# Patient Record
Sex: Male | Born: 1950 | Race: White | Hispanic: No | Marital: Married | Smoking: Never smoker
Health system: Southern US, Community
[De-identification: ages and names within clinical notes are randomized; demographics above are authoritative.]

## PROBLEM LIST (undated history)

## (undated) DIAGNOSIS — Z860100 Personal history of colon polyps, unspecified: Secondary | ICD-10-CM

## (undated) DIAGNOSIS — C61 Malignant neoplasm of prostate: Secondary | ICD-10-CM

## (undated) DIAGNOSIS — R112 Nausea with vomiting, unspecified: Secondary | ICD-10-CM

## (undated) DIAGNOSIS — Z9889 Other specified postprocedural states: Secondary | ICD-10-CM

## (undated) DIAGNOSIS — M2550 Pain in unspecified joint: Secondary | ICD-10-CM

## (undated) DIAGNOSIS — K760 Fatty (change of) liver, not elsewhere classified: Secondary | ICD-10-CM

## (undated) DIAGNOSIS — Z87442 Personal history of urinary calculi: Secondary | ICD-10-CM

## (undated) DIAGNOSIS — Z86718 Personal history of other venous thrombosis and embolism: Secondary | ICD-10-CM

## (undated) DIAGNOSIS — K219 Gastro-esophageal reflux disease without esophagitis: Secondary | ICD-10-CM

## (undated) DIAGNOSIS — Z8489 Family history of other specified conditions: Secondary | ICD-10-CM

## (undated) DIAGNOSIS — I82409 Acute embolism and thrombosis of unspecified deep veins of unspecified lower extremity: Secondary | ICD-10-CM

## (undated) DIAGNOSIS — J302 Other seasonal allergic rhinitis: Secondary | ICD-10-CM

## (undated) DIAGNOSIS — I10 Essential (primary) hypertension: Secondary | ICD-10-CM

## (undated) DIAGNOSIS — Z923 Personal history of irradiation: Secondary | ICD-10-CM

## (undated) DIAGNOSIS — R51 Headache: Secondary | ICD-10-CM

## (undated) DIAGNOSIS — M1990 Unspecified osteoarthritis, unspecified site: Secondary | ICD-10-CM

## (undated) HISTORY — DX: Personal history of irradiation: Z92.3

## (undated) HISTORY — DX: Essential (primary) hypertension: I10

## (undated) HISTORY — PX: VASECTOMY: SHX75

## (undated) HISTORY — DX: Malignant neoplasm of prostate: C61

## (undated) HISTORY — PX: OTHER SURGICAL HISTORY: SHX169

## (undated) HISTORY — PX: COLONOSCOPY: SHX174

## (undated) HISTORY — DX: Gastro-esophageal reflux disease without esophagitis: K21.9

## (undated) HISTORY — DX: Acute embolism and thrombosis of unspecified deep veins of unspecified lower extremity: I82.409

---

## 1998-08-03 ENCOUNTER — Emergency Department (HOSPITAL_COMMUNITY): Admission: EM | Admit: 1998-08-03 | Discharge: 1998-08-03 | Payer: Self-pay

## 2000-03-09 ENCOUNTER — Emergency Department (HOSPITAL_COMMUNITY): Admission: EM | Admit: 2000-03-09 | Discharge: 2000-03-09 | Payer: Self-pay | Admitting: Emergency Medicine

## 2005-07-20 ENCOUNTER — Encounter: Admission: RE | Admit: 2005-07-20 | Discharge: 2005-07-20 | Payer: Self-pay | Admitting: Family Medicine

## 2006-08-12 ENCOUNTER — Encounter: Admission: RE | Admit: 2006-08-12 | Discharge: 2006-08-12 | Payer: Self-pay | Admitting: Family Medicine

## 2006-10-21 ENCOUNTER — Encounter: Admission: RE | Admit: 2006-10-21 | Discharge: 2006-10-21 | Payer: Self-pay | Admitting: Family Medicine

## 2007-04-22 ENCOUNTER — Encounter: Admission: RE | Admit: 2007-04-22 | Discharge: 2007-04-22 | Payer: Self-pay | Admitting: Family Medicine

## 2008-08-27 ENCOUNTER — Encounter: Admission: RE | Admit: 2008-08-27 | Discharge: 2008-08-27 | Payer: Self-pay | Admitting: Family Medicine

## 2010-03-06 ENCOUNTER — Encounter: Payer: Self-pay | Admitting: Family Medicine

## 2010-04-07 DIAGNOSIS — C61 Malignant neoplasm of prostate: Secondary | ICD-10-CM

## 2010-04-07 HISTORY — DX: Malignant neoplasm of prostate: C61

## 2010-04-18 ENCOUNTER — Other Ambulatory Visit (HOSPITAL_COMMUNITY): Payer: Self-pay | Admitting: Urology

## 2010-04-18 DIAGNOSIS — C61 Malignant neoplasm of prostate: Secondary | ICD-10-CM

## 2010-04-21 ENCOUNTER — Encounter (HOSPITAL_COMMUNITY): Payer: Self-pay

## 2010-04-21 ENCOUNTER — Ambulatory Visit (HOSPITAL_COMMUNITY): Payer: Self-pay

## 2010-04-26 ENCOUNTER — Encounter (HOSPITAL_COMMUNITY): Payer: Self-pay

## 2010-04-26 ENCOUNTER — Encounter (HOSPITAL_COMMUNITY)
Admission: RE | Admit: 2010-04-26 | Discharge: 2010-04-26 | Disposition: A | Discharge status: Home / Self Care | Payer: PRIVATE HEALTH INSURANCE | Source: Ambulatory Visit | Attending: Urology | Admitting: Urology

## 2010-04-26 ENCOUNTER — Ambulatory Visit (HOSPITAL_COMMUNITY)
Admission: RE | Admit: 2010-04-26 | Discharge: 2010-04-26 | Disposition: A | Discharge status: Home / Self Care | Payer: PRIVATE HEALTH INSURANCE | Source: Ambulatory Visit | Attending: Urology | Admitting: Urology

## 2010-04-26 DIAGNOSIS — C61 Malignant neoplasm of prostate: Secondary | ICD-10-CM | POA: Insufficient documentation

## 2010-04-26 MED ORDER — TECHNETIUM TC 99M MEDRONATE IV KIT
24.0000 | PACK | Freq: Once | INTRAVENOUS | Status: AC | PRN
  Administered 2010-04-26: 24 via INTRAVENOUS

## 2010-05-22 ENCOUNTER — Ambulatory Visit: Payer: PRIVATE HEALTH INSURANCE | Attending: Radiation Oncology | Admitting: Radiation Oncology

## 2010-05-22 DIAGNOSIS — I10 Essential (primary) hypertension: Secondary | ICD-10-CM | POA: Insufficient documentation

## 2010-05-22 DIAGNOSIS — Z86718 Personal history of other venous thrombosis and embolism: Secondary | ICD-10-CM | POA: Insufficient documentation

## 2010-05-22 DIAGNOSIS — Z79899 Other long term (current) drug therapy: Secondary | ICD-10-CM | POA: Insufficient documentation

## 2010-05-22 DIAGNOSIS — K219 Gastro-esophageal reflux disease without esophagitis: Secondary | ICD-10-CM | POA: Insufficient documentation

## 2010-05-22 DIAGNOSIS — C61 Malignant neoplasm of prostate: Secondary | ICD-10-CM | POA: Insufficient documentation

## 2010-05-22 DIAGNOSIS — E785 Hyperlipidemia, unspecified: Secondary | ICD-10-CM | POA: Insufficient documentation

## 2010-05-22 DIAGNOSIS — Z7982 Long term (current) use of aspirin: Secondary | ICD-10-CM | POA: Insufficient documentation

## 2010-05-22 DIAGNOSIS — Z806 Family history of leukemia: Secondary | ICD-10-CM | POA: Insufficient documentation

## 2010-06-07 ENCOUNTER — Other Ambulatory Visit (HOSPITAL_COMMUNITY): Payer: Self-pay | Admitting: Urology

## 2010-06-07 ENCOUNTER — Other Ambulatory Visit: Payer: Self-pay | Admitting: Urology

## 2010-06-07 ENCOUNTER — Encounter (HOSPITAL_COMMUNITY): Payer: PRIVATE HEALTH INSURANCE

## 2010-06-07 ENCOUNTER — Ambulatory Visit (HOSPITAL_COMMUNITY)
Admission: RE | Admit: 2010-06-07 | Discharge: 2010-06-07 | Disposition: A | Discharge status: Home / Self Care | Payer: PRIVATE HEALTH INSURANCE | Source: Ambulatory Visit | Attending: Urology | Admitting: Urology

## 2010-06-07 DIAGNOSIS — Z01818 Encounter for other preprocedural examination: Secondary | ICD-10-CM | POA: Insufficient documentation

## 2010-06-07 DIAGNOSIS — Z01812 Encounter for preprocedural laboratory examination: Secondary | ICD-10-CM | POA: Insufficient documentation

## 2010-06-07 DIAGNOSIS — C61 Malignant neoplasm of prostate: Secondary | ICD-10-CM | POA: Insufficient documentation

## 2010-06-07 DIAGNOSIS — I10 Essential (primary) hypertension: Secondary | ICD-10-CM | POA: Insufficient documentation

## 2010-06-07 DIAGNOSIS — Z01810 Encounter for preprocedural cardiovascular examination: Secondary | ICD-10-CM | POA: Insufficient documentation

## 2010-06-07 DIAGNOSIS — R9431 Abnormal electrocardiogram [ECG] [EKG]: Secondary | ICD-10-CM | POA: Insufficient documentation

## 2010-06-07 LAB — CBC
HCT: 41.7 % (ref 39.0–52.0)
Hemoglobin: 13.9 g/dL (ref 13.0–17.0)
MCH: 29 pg (ref 26.0–34.0)
MCHC: 33.3 g/dL (ref 30.0–36.0)
MCV: 86.9 fL (ref 78.0–100.0)
Platelets: 194 K/uL (ref 150–400)
RBC: 4.8 MIL/uL (ref 4.22–5.81)
RDW: 12.5 % (ref 11.5–15.5)
WBC: 6.7 K/uL (ref 4.0–10.5)

## 2010-06-07 LAB — BASIC METABOLIC PANEL WITH GFR
BUN: 11 mg/dL (ref 6–23)
CO2: 27 meq/L (ref 19–32)
Calcium: 9.3 mg/dL (ref 8.4–10.5)
Chloride: 105 meq/L (ref 96–112)
Creatinine, Ser: 1.06 mg/dL (ref 0.4–1.5)
GFR calc non Af Amer: >60 mL/min
Glucose, Bld: 103 mg/dL — ABNORMAL HIGH (ref 70–99)
Potassium: 3.9 meq/L (ref 3.5–5.1)
Sodium: 138 meq/L (ref 135–145)

## 2010-06-07 LAB — TYPE AND SCREEN
ABO/RH(D): O POS
Antibody Screen: NEGATIVE

## 2010-06-07 LAB — SURGICAL PCR SCREEN
MRSA, PCR: NEGATIVE
Staphylococcus aureus: NEGATIVE

## 2010-06-07 LAB — ABO/RH: ABO/RH(D): O POS

## 2010-06-12 ENCOUNTER — Inpatient Hospital Stay (HOSPITAL_COMMUNITY)
Admission: RE | Admit: 2010-06-12 | Discharge: 2010-06-12 | DRG: 724 | Disposition: A | Discharge status: Home / Self Care | Payer: PRIVATE HEALTH INSURANCE | Source: Ambulatory Visit | Attending: Urology | Admitting: Urology

## 2010-06-12 DIAGNOSIS — C61 Malignant neoplasm of prostate: Principal | ICD-10-CM | POA: Diagnosis present

## 2010-06-22 ENCOUNTER — Inpatient Hospital Stay (HOSPITAL_COMMUNITY): Admission: RE | Admit: 2010-06-22 | Payer: PRIVATE HEALTH INSURANCE | Source: Ambulatory Visit | Admitting: Urology

## 2010-06-22 ENCOUNTER — Inpatient Hospital Stay (HOSPITAL_COMMUNITY)
Admission: RE | Admit: 2010-06-22 | Discharge: 2010-06-23 | DRG: 708 | Disposition: A | Discharge status: Home / Self Care | Payer: PRIVATE HEALTH INSURANCE | Source: Ambulatory Visit | Attending: Urology | Admitting: Urology

## 2010-06-22 ENCOUNTER — Ambulatory Visit (HOSPITAL_COMMUNITY): Admission: RE | Admit: 2010-06-22 | Payer: PRIVATE HEALTH INSURANCE | Source: Ambulatory Visit | Admitting: Urology

## 2010-06-22 ENCOUNTER — Other Ambulatory Visit: Payer: Self-pay | Admitting: Urology

## 2010-06-22 DIAGNOSIS — E785 Hyperlipidemia, unspecified: Secondary | ICD-10-CM | POA: Diagnosis present

## 2010-06-22 DIAGNOSIS — K219 Gastro-esophageal reflux disease without esophagitis: Secondary | ICD-10-CM | POA: Diagnosis present

## 2010-06-22 DIAGNOSIS — Z7982 Long term (current) use of aspirin: Secondary | ICD-10-CM

## 2010-06-22 DIAGNOSIS — Z01812 Encounter for preprocedural laboratory examination: Secondary | ICD-10-CM

## 2010-06-22 DIAGNOSIS — C61 Malignant neoplasm of prostate: Principal | ICD-10-CM | POA: Diagnosis present

## 2010-06-22 DIAGNOSIS — I10 Essential (primary) hypertension: Secondary | ICD-10-CM | POA: Diagnosis present

## 2010-06-22 DIAGNOSIS — M109 Gout, unspecified: Secondary | ICD-10-CM | POA: Diagnosis present

## 2010-06-22 DIAGNOSIS — Z87442 Personal history of urinary calculi: Secondary | ICD-10-CM

## 2010-06-22 DIAGNOSIS — Z86718 Personal history of other venous thrombosis and embolism: Secondary | ICD-10-CM

## 2010-06-22 DIAGNOSIS — Z79899 Other long term (current) drug therapy: Secondary | ICD-10-CM

## 2010-06-22 LAB — TYPE AND SCREEN
ABO/RH(D): O POS
Antibody Screen: NEGATIVE

## 2010-06-22 LAB — HEMOGLOBIN AND HEMATOCRIT, BLOOD: HCT: 40.3 % (ref 39.0–52.0)

## 2010-06-23 LAB — HEMOGLOBIN AND HEMATOCRIT, BLOOD: Hemoglobin: 13.1 g/dL (ref 13.0–17.0)

## 2010-06-26 NOTE — Op Note (Signed)
Joshua Young, Joshua Young               ACCOUNT NO.:  [id number]  MEDICAL RECORD NO.:  [id number]           PATIENT TYPE:  O  LOCATION:  DAYL                         FACILITY:  [facility]  PHYSICIAN:  Heloise Purpura, MD      DATE OF BIRTH:  05-Jun-1950  DATE OF PROCEDURE:  06/22/2010 DATE OF DISCHARGE:                              OPERATIVE REPORT   PREOPERATIVE DIAGNOSIS:  Clinically localized adenocarcinoma of prostate (clinical stage T2b, N0, M0).  POSTOPERATIVE DIAGNOSIS:  Clinically localized adenocarcinoma of prostate (clinical stage T2b, N0, M0).  PROCEDURES: 1. Robotic-assisted laparoscopic radical prostatectomy (left nerve     sparing). 2. Extended bilateral robotic-assisted laparoscopic pelvic     lymphadenectomy.  SURGEON:  Heloise Purpura, MD  ASSISTANT:  Delia Chimes, NP  ANESTHESIA:  General.  COMPLICATIONS:  None.  ESTIMATED BLOOD LOSS:  100 mL.  INTRAVENOUS FLUIDS:  1500 mL  of lactated Ringer's.  SPECIMENS: 1. Prostate and seminal vesicles. 2. Right pelvic lymph nodes. 3. Left pelvic lymph nodes.  DISPOSITION:  Specimens to pathology.  DRAINS: 1. 20-French Coude catheter. 2. #19 Blake pelvic drain.  INDICATIONS:  [name] is a 59 year old gentleman who was found to have clinically localized prostate cancer.  He was noted to have high- risk disease and underwent staging studies which were negative for obvious metastatic disease.  After discussing management options for treatment, he elected to proceed with primary surgical therapy, understanding the high risk for potentially needing multimodality therapy in the adjuvant or salvage setting.  The potential risks, complications, and alternative treatment options associated with the above procedures were discussed in detail and informed consent was obtained.  DESCRIPTION OF PROCEDURE:  The patient was taken to the operating room and a general anesthetic was administered.  He was given  preoperative antibiotics, placed in the dorsal lithotomy position, and prepped and draped in the usual sterile fashion.  Next, a Foley catheter was inserted into the bladder and a site was selected just superior to the umbilicus for placement of the camera port.  This was placed using a standard open Hasson technique which allowed entry into the peritoneal cavity under direct vision without difficulty.  12-mm port was placed and a pneumoperitoneum established.  The 0-degree lens was then used to inspect the abdomen and there was no evidence of any intra-abdominal injuries or other abnormalities.  The remaining ports were then placed with 8-mm robotic ports placed in the right lower quadrant, left lower quadrant, and far left lower quadrant, 5-mm port was placed between the camera port and the right robotic port and a 12-mm port was placed in the far right lateral abdominal wall for laparoscopic assistance.  All ports were placed under direct vision without difficulty.  The surgical cart was then docked.  With the aid of the cautery scissors, the bladder was reflected posteriorly, allowing entry into space of Retzius and identification of the endopelvic fascia and prostate.  The endopelvic fascia was then incised from the apex back to the base of the prostate bilaterally and the underlying levator muscle fibers were swept laterally off the prostate, thereby isolating the dorsal venous complex.  The dorsal vein was then stapled and divided with a 45-mm flex Echelon stapler.  The bladder neck was identified with the aid of Foley catheter manipulation and was divided anteriorly.  This exposed the catheter and the catheter balloon was deflated.  The catheter was brought into the operative field and used to retract the prostate anteriorly.  The posterior bladder neck was then divided and dissection proceeded between the prostate and bladder until the vasa deferentia and seminal vesicles were  identified.  The vasa deferentia were isolated, divided and lifted anteriorly and the seminal vesicles were also dissected down to their tips with care to control seminal vesicle arterial blood supply.  These structures were then also lifted anteriorly and the space between Denonvilliers' fascia and the anterior rectum was developed with a combination of blunt and sharp dissection.  This isolated the vascular pedicles of prostate.  On the left side, the lateral prostatic fascia was sharply incised, allowing the neurovascular bundles to be released. The vascular pedicles of prostate were ligated with Hem-o-Lok clips and divided with sharp cold scissor dissection.  On the right side of the prostate, the vascular pedicles were ligated in a wide non-nerve sparing fashion with Hem-o-Lok clips and divided.  The urethra was sharply transected, allowing the prostate specimen to be disarticulated.  The pelvis was copiously irrigated and hemostasis was ensured.  There was no evidence of a rectal injury.  Attention then turned to the right pelvic sidewall.  An extended pelvic lymphadenectomy was then performed with the fibrofatty tissue removed from the genitofemoral nerve to Cooper's ligament to the hypogastric artery posteriorly and the bifurcation of the iliac vessels superiorly with care to preserve the obturator nerve and vascular structures within this template.  Hemo-o-Lok clips were used for lymphostasis and hemostasis and these lymphatic packets were passed off for permanent pathologic analysis.  An identical procedure was then performed on the contralateral side.  Attention then turned to the urethral anastomosis.  2-0 Vicryl slip-knot was placed between the Denonvilliers' fascia, the posterior bladder neck, and the posterior urethra to re-approximate these structures.  A double-armed 3-0 Monocryl suture was then used to perform a 360-degree running tension-free anastomosis between these  structures.  A new 20-French Coude catheter was inserted into the bladder and irrigated.  There were no blood clots within the bladder and the anastomosis appeared to be watertight.  A #19 Blake drain was then brought to the left robotic port and positioned appropriately within the pelvis.  It was secured to the skin with a nylon suture.  The surgical cart was then undocked.  The right lateral 12-mm port site was closed with a 0 Vicryl suture placed laparoscopically.  All remaining ports were removed under direct vision. The prostate specimen and lymphatic packets were removed intact within the Endopouch retrieval bag via the periumbilical port site.  This fascial opening was closed with 2 running 0 Vicryl sutures.  All port sites were injected with 0.25% Marcaine and re-approximated at the skin level with staples.  Sterile dressings were applied.  The patient appeared to tolerate the procedure well without complications.  He was able to be extubated and transferred to the recovery unit in satisfactory condition.     Heloise Purpura, MD     LB/MEDQ  D:  06/22/2010  T:  06/22/2010  Job:  [id number]  Electronically Signed by Heloise Purpura MD on 06/26/2010 08:01:14 PM

## 2010-08-30 ENCOUNTER — Ambulatory Visit
Admission: RE | Admit: 2010-08-30 | Discharge: 2010-08-30 | Disposition: A | Discharge status: Home / Self Care | Payer: PRIVATE HEALTH INSURANCE | Source: Ambulatory Visit | Attending: Radiation Oncology | Admitting: Radiation Oncology

## 2010-08-30 DIAGNOSIS — Z510 Encounter for antineoplastic radiation therapy: Secondary | ICD-10-CM | POA: Insufficient documentation

## 2010-08-30 DIAGNOSIS — Z9079 Acquired absence of other genital organ(s): Secondary | ICD-10-CM | POA: Insufficient documentation

## 2010-08-30 DIAGNOSIS — C61 Malignant neoplasm of prostate: Secondary | ICD-10-CM | POA: Insufficient documentation

## 2010-11-28 ENCOUNTER — Ambulatory Visit
Admission: RE | Admit: 2010-11-28 | Discharge: 2010-11-28 | Disposition: A | Discharge status: Home / Self Care | Payer: PRIVATE HEALTH INSURANCE | Source: Ambulatory Visit | Attending: Radiation Oncology | Admitting: Radiation Oncology

## 2010-11-28 DIAGNOSIS — C61 Malignant neoplasm of prostate: Secondary | ICD-10-CM | POA: Insufficient documentation

## 2010-11-28 DIAGNOSIS — Z510 Encounter for antineoplastic radiation therapy: Secondary | ICD-10-CM | POA: Insufficient documentation

## 2010-12-12 ENCOUNTER — Telehealth: Payer: Self-pay | Admitting: *Deleted

## 2010-12-28 ENCOUNTER — Encounter: Payer: Self-pay | Admitting: *Deleted

## 2010-12-28 DIAGNOSIS — E785 Hyperlipidemia, unspecified: Secondary | ICD-10-CM | POA: Insufficient documentation

## 2010-12-28 DIAGNOSIS — Z86718 Personal history of other venous thrombosis and embolism: Secondary | ICD-10-CM | POA: Insufficient documentation

## 2010-12-28 NOTE — Progress Notes (Signed)
[employer] Paediatric nurse, married  Radiation tomotherapy -9/5/121 thru 12/08/10

## 2011-01-18 ENCOUNTER — Encounter: Payer: Self-pay | Admitting: Radiation Oncology

## 2011-01-18 ENCOUNTER — Encounter: Payer: Self-pay | Admitting: *Deleted

## 2011-01-18 ENCOUNTER — Ambulatory Visit
Admission: RE | Admit: 2011-01-18 | Discharge: 2011-01-18 | Disposition: A | Discharge status: Home / Self Care | Payer: PRIVATE HEALTH INSURANCE | Source: Ambulatory Visit | Attending: Radiation Oncology | Admitting: Radiation Oncology

## 2011-01-18 VITALS — BP 136/78 | HR 64 | Temp 97.1°F | Resp 20 | Ht 68.0 in | Wt 230.6 lb

## 2011-01-18 DIAGNOSIS — C61 Malignant neoplasm of prostate: Secondary | ICD-10-CM

## 2011-01-18 NOTE — Progress Notes (Addendum)
  Radiation Oncology         [phone] ________________________________  Name: Joshua Young MRN: [id number]  Date: 01/18/2011  DOB: 1950/07/13       Follow-Up Visit Note  CC: Joshua Found, MD  interval since  Joshua Purpura, MD  Joshua Young, M.D.   DIAGNOSIS:  A 60 year old gentleman with a pathologic stage T3a adenocarcinoma of the prostate with Gleason score 4 + 3 and an undetectable postoperative PSA.  Interval since TREATMENT:  6 weeks.  NARRATIVE:  The patient returns today for routine followup assessment.  Joshua Young tolerated radiation treatment relatively well other than some increase in urinary incontinence episodes particularly during his final week of treatment. He reports that this persisted with coughing and sneezing. However if his urinary continence is beginning to return to baseline.Marland Kitchen  He wears a pad again for confidence..  his IPS test score today was=5.  His fatigue has now resolved,   Meds. . . . . . . . . . . . Current Outpatient Prescriptions  Medication Sig Dispense Refill  . aspirin 325 MG tablet Take 325 mg by mouth daily.        . fexofenadine (ALLEGRA) 180 MG tablet Take 180 mg by mouth daily as needed.        . fish oil-omega-3 fatty acids 1000 MG capsule Take 1 capsule by mouth daily.        Marland Kitchen losartan (COZAAR) 50 MG tablet Take 50 mg by mouth daily.        . Multiple Vitamins-Minerals (CENTRUM SILVER PO) Take 1 tablet by mouth daily.        Marland Kitchen omeprazole (PRILOSEC) 20 MG capsule Take 20 mg by mouth daily.        . tadalafil (CIALIS) 5 MG tablet Take 5 mg by mouth daily as needed.        . vitamin C (ASCORBIC ACID) 500 MG tablet Take 500 mg by mouth daily.          Physical Findings. . .  height is 5\' 8"  (1.727 m) and weight is 230 lb 9.6 oz (104.599 kg). His oral temperature is 97.1 F (36.2 C). His blood pressure is 136/78 and his pulse is 64. His respiration is 20.   Impression . . . . . . . The patient is recovering from the effects of  radiation.  He is regaining urinary continence to his baseline level. He has no rectal symptoms at this time.  Plan . . . . . . . . . . . Marland Kitchen the patient is scheduled for ongoing followup care with Dr. Laverle Young. I will look forward to following his progress through urology. I encouraged the patient to her return to our office or call if he has any questions or concerns in the future. We did spent time today talking about some long-term effects related radiation treatment including the risk for other urinary complications and potential intermittent rectal bleeding in the future. The patient was not given any formal followup in the radiation oncology clinic but I will be happy to follow his progress on an as-needed basis.  _____________________________________  Artist Pais Kathrynn Running, M.D.

## 2012-12-09 ENCOUNTER — Ambulatory Visit (HOSPITAL_COMMUNITY)
Admission: RE | Admit: 2012-12-09 | Discharge: 2012-12-09 | Disposition: A | Discharge status: Home / Self Care | Payer: PRIVATE HEALTH INSURANCE | Source: Ambulatory Visit | Attending: Family Medicine | Admitting: Family Medicine

## 2012-12-09 ENCOUNTER — Other Ambulatory Visit (HOSPITAL_COMMUNITY): Payer: Self-pay | Admitting: Family Medicine

## 2012-12-09 DIAGNOSIS — Z86718 Personal history of other venous thrombosis and embolism: Secondary | ICD-10-CM | POA: Insufficient documentation

## 2012-12-09 DIAGNOSIS — C61 Malignant neoplasm of prostate: Secondary | ICD-10-CM | POA: Insufficient documentation

## 2012-12-09 DIAGNOSIS — M7989 Other specified soft tissue disorders: Secondary | ICD-10-CM

## 2012-12-09 DIAGNOSIS — M25561 Pain in right knee: Secondary | ICD-10-CM

## 2012-12-09 DIAGNOSIS — M25569 Pain in unspecified knee: Secondary | ICD-10-CM | POA: Insufficient documentation

## 2012-12-09 NOTE — Progress Notes (Signed)
Bilateral lower extremity venous duplex completed.  Right:  No evidence of DVT, superficial thrombosis, or Baker's cyst.  Left: DVT noted in the popliteal, posterior tibial, and peroneal veins.  No evidence of superficial thrombosis.  No Baker's cyst.

## 2012-12-23 ENCOUNTER — Telehealth: Payer: Self-pay | Admitting: Critical Care Medicine

## 2012-12-23 ENCOUNTER — Emergency Department (HOSPITAL_COMMUNITY): Payer: PRIVATE HEALTH INSURANCE

## 2012-12-23 ENCOUNTER — Emergency Department (HOSPITAL_COMMUNITY)
Admission: EM | Admit: 2012-12-23 | Discharge: 2012-12-23 | Disposition: A | Discharge status: Home / Self Care | Payer: PRIVATE HEALTH INSURANCE | Attending: Emergency Medicine | Admitting: Emergency Medicine

## 2012-12-23 ENCOUNTER — Encounter (HOSPITAL_COMMUNITY): Payer: Self-pay | Admitting: Emergency Medicine

## 2012-12-23 DIAGNOSIS — Z7901 Long term (current) use of anticoagulants: Secondary | ICD-10-CM | POA: Insufficient documentation

## 2012-12-23 DIAGNOSIS — K219 Gastro-esophageal reflux disease without esophagitis: Secondary | ICD-10-CM | POA: Insufficient documentation

## 2012-12-23 DIAGNOSIS — I10 Essential (primary) hypertension: Secondary | ICD-10-CM | POA: Insufficient documentation

## 2012-12-23 DIAGNOSIS — Z86718 Personal history of other venous thrombosis and embolism: Secondary | ICD-10-CM | POA: Insufficient documentation

## 2012-12-23 DIAGNOSIS — I2699 Other pulmonary embolism without acute cor pulmonale: Secondary | ICD-10-CM | POA: Insufficient documentation

## 2012-12-23 DIAGNOSIS — Z923 Personal history of irradiation: Secondary | ICD-10-CM | POA: Insufficient documentation

## 2012-12-23 DIAGNOSIS — Z8546 Personal history of malignant neoplasm of prostate: Secondary | ICD-10-CM | POA: Insufficient documentation

## 2012-12-23 DIAGNOSIS — E785 Hyperlipidemia, unspecified: Secondary | ICD-10-CM | POA: Insufficient documentation

## 2012-12-23 DIAGNOSIS — Z79899 Other long term (current) drug therapy: Secondary | ICD-10-CM | POA: Insufficient documentation

## 2012-12-23 DIAGNOSIS — Z87442 Personal history of urinary calculi: Secondary | ICD-10-CM | POA: Insufficient documentation

## 2012-12-23 DIAGNOSIS — IMO0002 Reserved for concepts with insufficient information to code with codable children: Secondary | ICD-10-CM | POA: Insufficient documentation

## 2012-12-23 LAB — BASIC METABOLIC PANEL
CO2: 25 mEq/L (ref 19–32)
Calcium: 8.9 mg/dL (ref 8.4–10.5)
Chloride: 104 mEq/L (ref 96–112)
Creatinine, Ser: 0.94 mg/dL (ref 0.50–1.35)
GFR calc Af Amer: >90 mL/min (ref >90)
Glucose, Bld: 77 mg/dL (ref 70–99)
Potassium: 3.6 mEq/L (ref 3.5–5.1)
Sodium: 139 mEq/L (ref 135–145)

## 2012-12-23 LAB — CBC WITH DIFFERENTIAL/PLATELET
Basophils Absolute: 0.1 10*3/uL (ref 0.0–0.1)
Eosinophils Relative: 5 % (ref 0–5)
HCT: 39.8 % (ref 39.0–52.0)
Lymphocytes Relative: 17 % (ref 12–46)
Lymphs Abs: 1.3 10*3/uL (ref 0.7–4.0)
MCV: 86.5 fL (ref 78.0–100.0)
Monocytes Absolute: 0.6 10*3/uL (ref 0.1–1.0)
Neutro Abs: 5.3 10*3/uL (ref 1.7–7.7)
Platelets: 281 10*3/uL (ref 150–400)
RBC: 4.6 MIL/uL (ref 4.22–5.81)
WBC: 7.7 10*3/uL (ref 4.0–10.5)

## 2012-12-23 LAB — TROPONIN I: Troponin I: <0.3 ng/mL (ref <0.30)

## 2012-12-23 MED ORDER — IOHEXOL 350 MG/ML SOLN
100.0000 mL | Freq: Once | INTRAVENOUS | Status: AC | PRN
  Administered 2012-12-23: 100 mL via INTRAVENOUS

## 2012-12-23 NOTE — Telephone Encounter (Signed)
Pt on xarelto 15mg  bid since 10/29 for LLE DVT tibial/peroneal vein.  Now small RLL PE mildy symptomatic Pt needs a f/u OV /consult with any Pulm MD  I cleared the pt to stay on xarelto for now 15mg  bid

## 2012-12-23 NOTE — ED Notes (Signed)
Dr Craige Cotta in pt room

## 2012-12-23 NOTE — ED Provider Notes (Signed)
CSN: [id number]     Arrival date & time 12/23/12  1454 History   First MD Initiated Contact with Patient 12/23/12 1511     Chief Complaint  Patient presents with  . Shortness of Breath   (Consider location/radiation/quality/duration/timing/severity/associated sxs/prior Treatment) The history is provided by the patient. No language interpreter was used.   Patient is 62 year old Caucasian male with past medical history of prostate cancer and DVTs who comes emergency department today with shortness of breath. Is currently being treated for 2 DVTs in his left lower extremity. He is on Xarelto 15 mg twice a day. He just started his relative approximately a half ago and has been doing well. Today he was at work when he developed an episode of shortness of breath and dizziness. He was at rest it occurred. He did not have any chest pain associated with it. He did not lose consciousness. He had no exertional component to his shortness of breath.  Past Medical History  Diagnosis Date  . Prostate cancer 04/07/10    gleason 8, volume 30.069cc  . Hypertension   . Kidney stones   . GERD (gastroesophageal reflux disease)   . Gout   . Hyperlipidemia   . DVT, lower extremity   . Personal history of thromboembolic disease   . S/P radiation therapy 4-12 wks ago 9/12-10-12    prostate   Past Surgical History  Procedure Laterality Date  . Vasectomy     Family History  Problem Relation Age of Onset  . Cancer Father     CLL   History  Substance Use Topics  . Smoking status: Never Smoker   . Smokeless tobacco: Not on file  . Alcohol Use: Yes     Comment: rare    Review of Systems  Constitutional: Negative for fever and chills.  Respiratory: Positive for shortness of breath. Negative for cough and wheezing.   Cardiovascular: Negative for chest pain.  Gastrointestinal: Negative for nausea, vomiting, diarrhea and constipation.  Genitourinary: Negative for dysuria, urgency and frequency.   Musculoskeletal: Negative for back pain and neck pain.  Skin: Negative for color change and wound.  Neurological: Negative for dizziness, weakness, numbness and headaches.  All other systems reviewed and are negative.    Allergies  Review of patient's allergies indicates no known allergies.  Home Medications   Current Outpatient Rx  Name  Route  Sig  Dispense  Refill  . fexofenadine (ALLEGRA) 180 MG tablet   Oral   Take 180 mg by mouth daily as needed.           . fish oil-omega-3 fatty acids 1000 MG capsule   Oral   Take 1 capsule by mouth daily.           Marland Kitchen losartan (COZAAR) 50 MG tablet   Oral   Take 50 mg by mouth daily.           . mometasone (NASONEX) 50 MCG/ACT nasal spray   Nasal   Place 2 sprays into the nose daily.         . Multiple Vitamins-Minerals (CENTRUM SILVER PO)   Oral   Take 1 tablet by mouth daily.           Marland Kitchen omeprazole (PRILOSEC) 20 MG capsule   Oral   Take 20 mg by mouth daily.           . rivaroxaban (XARELTO) 10 MG TABS tablet   Oral   Take 10 mg by mouth 2 (two)  times daily.         . tadalafil (CIALIS) 5 MG tablet   Oral   Take 5 mg by mouth daily as needed.           . vitamin C (ASCORBIC ACID) 500 MG tablet   Oral   Take 500 mg by mouth daily.            BP 132/77  Pulse 68  Temp(Src) 98.5 F (36.9 C)  Resp 20  SpO2 100% Physical Exam  Nursing note and vitals reviewed. Constitutional: He is oriented to person, place, and time. He appears well-developed and well-nourished. No distress.  HENT:  Head: Normocephalic and atraumatic.  Eyes: Pupils are equal, round, and reactive to light.  Neck: Normal range of motion.  Cardiovascular: Normal rate, regular rhythm and normal heart sounds.   Pulmonary/Chest: Effort normal and breath sounds normal. No respiratory distress. He has no decreased breath sounds. He has no wheezes. He has no rhonchi. He has no rales.  Abdominal: Soft. He exhibits no distension. There  is no tenderness. There is no rebound and no guarding.  Musculoskeletal: He exhibits no edema and no tenderness.  Neurological: He is alert and oriented to person, place, and time. He exhibits normal muscle tone.  Skin: Skin is warm and dry.    ED Course  Procedures (including critical care time) Labs Review Labs Reviewed  BASIC METABOLIC PANEL - Abnormal; Notable for the following:    GFR calc non Af Amer 89 (*)    All other components within normal limits  CBC WITH DIFFERENTIAL  TROPONIN I   Imaging Review Ct Angio Chest W/cm &/or Wo Cm  12/23/2012   CLINICAL DATA:  Shortness of breath.  Recent DVT.  EXAM: CT ANGIOGRAPHY CHEST WITH CONTRAST  TECHNIQUE: Multidetector CT imaging of the chest was performed using the standard protocol during bolus administration of intravenous contrast. Multiplanar CT image reconstructions including MIPs were obtained to evaluate the vascular anatomy.  CONTRAST:  OMNIPAQUE IOHEXOL 350 MG/ML SOLN  COMPARISON:  Chest radiographs dated 06/07/2010. Abdomen CT at [facility] on 04/26/2010.  FINDINGS: Small filling defect in the proximal intersegmental pulmonary artery on the right. 2 small lower lobe pulmonary artery filling defects on the right. 4 mm nodule in the right middle lobe just beneath the minor fissure on image number 40, unchanged since 04/26/2010, most compatible with a small subpleural lymph node. 6 mm subpleural nodule above the minor fissure in the right upper lobe on image number 38 and coronal image number 37, also of most compatible with a small subpleural lymph node. No other lung nodules and no enlarged lymph nodes. 2.0 cm left lobe thyroid nodule on image 9. Thoracic spine degenerative changes. Mild diffuse low density of the liver relative to the spleen.  Review of the MIP images confirms the above findings.  IMPRESSION: 1. Small pulmonary emboli on the right, as described above. 2. 2 probable subpleural lymph nodes on the  right, as described above. 3. Stable diffuse hepatic steatosis. These results were called by telephone at the time of interpretation on 12/23/2012 at 6:58 PM to Dr. Bethann Berkshire , who verbally acknowledged these results.   Electronically Signed   By: Gordan Payment M.D.   On: 12/23/2012 19:01    EKG Interpretation     Ventricular Rate:  71 PR Interval:  191 QRS Duration: 107 QT Interval:  376 QTC Calculation: 409 R Axis:   50 Text Interpretation:  Sinus  rhythm Probable anteroseptal infarct, old No significant change since last tracing            MDM  Patient is 62 year old Caucasian male past medical history of DVT who comes emergency department today with shortness of breath. Physical exam as above. Initial workup included a CBC, BMP, troponin, CTA of his chest, and EKG. EKG as above. CBC unremarkable. BMP unremarkable. Troponin was negative. CT of the chest demonstrated a pulmonary embolism. With no chest pain no exertional component to his pain doubt an MI or ACS. With no evidence of pneumonia on CT doubt a pneumonia or pneumothorax. Patient's shortness of breath was felt to be caused by his pulmonary embolism. He is currently on Xarelto 15 mg twice a day and has been taking this for approximately a week and half. Patient is hemodynamically stable. Since his only been on anticoagulation for about a week and a half doubt that this is failure of his anticoagulation.  Since patient is hemodynamically stable he was not felt to require further treatment for his PE at this time. His care was discussed with pulmonology. They felt that since patient is hemodynamically stable and is only been taking Xarelto for a week and half that he did not require changes in his treatment at this time. They will contact them with an appointment within the next couple of days. I felt he was stable for discharge. Patient was instructed to followup with his primary care physician within the next couple of days. He was  instructed to follow-up with pulmonology within a week. He was instructed to return to the emergency department if he develops pain, worsening shortness of breath, or any other concerns. The patient expressed understanding. He was discharged in stable condition. Labs and imaging reviewed by myself and considered in medical decision-making. Imaging was interpreted by Radiology. Care was discussed with my attending Dr. Bernette Mayers.    1. Pulmonary embolism        Bethann Berkshire, MD 12/24/12 2956  Bethann Berkshire, MD 12/24/12 [phone]

## 2012-12-23 NOTE — ED Notes (Signed)
Pt. Sitting at his desk and had a suddne onset oo SOB without pain,  Pt. Also was diphoretic.  Presently, pt. Is feeling better. No sob noted, skin is p/w/d;

## 2012-12-24 NOTE — ED Provider Notes (Signed)
I saw and evaluated the patient, reviewed the resident's note and I agree with the findings and plan.  EKG Interpretation     Ventricular Rate:  71 PR Interval:  191 QRS Duration: 107 QT Interval:  376 QTC Calculation: 409 R Axis:   50 Text Interpretation:  Sinus rhythm Probable anteroseptal infarct, old No significant change since last tracing            Pt with h/o prostate cancer and recently diagnosed DVT started on Xarelto had self-limited episode of SOB and dizziness, asymptomatic now but CTA shows small PE.   Charles B. Bernette Mayers, MD 12/24/12 [phone]

## 2012-12-24 NOTE — Telephone Encounter (Signed)
I spoke with pt. He is scheduled to come in and see MW on friday. Nothing further needed

## 2012-12-26 ENCOUNTER — Encounter: Payer: Self-pay | Admitting: Internal Medicine

## 2012-12-26 ENCOUNTER — Ambulatory Visit (INDEPENDENT_AMBULATORY_CARE_PROVIDER_SITE_OTHER): Payer: PRIVATE HEALTH INSURANCE | Admitting: Internal Medicine

## 2012-12-26 ENCOUNTER — Other Ambulatory Visit: Payer: Self-pay | Admitting: Family Medicine

## 2012-12-26 VITALS — BP 130/84 | HR 79 | Temp 99.3°F | Ht 68.0 in | Wt 239.0 lb

## 2012-12-26 DIAGNOSIS — I2699 Other pulmonary embolism without acute cor pulmonale: Secondary | ICD-10-CM

## 2012-12-26 DIAGNOSIS — E041 Nontoxic single thyroid nodule: Secondary | ICD-10-CM

## 2012-12-26 NOTE — Patient Instructions (Addendum)
Please see patient coordinator before you leave today  to schedule echo next available  If your fainting spells return you will need further evaluation by Cardiology

## 2012-12-26 NOTE — Progress Notes (Signed)
  Subjective:    Patient ID: Joshua Young, male    DOB: 19-Jun-1950  MRN: [id number]  HPI  60 yowm never smoker with h/o DVT R leg 2008 with new dx of PE 12/23/12 referred by Dr Delford Field 12/26/12 to pulmonary clinic    12/26/2012 1st [facility] office visit/ [name] cc recurrent L calf pain" like 2008" while on trip to [place] around last week in Oct 2014 with Pos venous doppler 12/09/12  rx with xarelto   and then on 12/23/12 had spell at rest x 20 min sitting at desk = dizzy / faint/ sob / heart pounding > EMT said heart racing - felt fine again s rx  After 20 min> none since but mild doe, min dry cough   No obvious day to day or daytime variabilty or assoc chronic cough or cp or chest tightness, subjective wheeze overt   hb symptoms. No unusual exp hx or h/o childhood pna/ asthma or knowledge of premature birth.  Sleeping ok without nocturnal  or early am exacerbation  of respiratory  c/o's or need for noct saba. Also denies any obvious fluctuation of symptoms with weather or environmental changes or other aggravating or alleviating factors except as outlined above   Current Medications, Allergies, Complete Past Medical History, Past Surgical History, Family History, and Social History were reviewed in [employer] record.           Review of Systems  Constitutional: Negative for fever and unexpected weight change.  HENT: Positive for congestion, postnasal drip and sinus pressure. Negative for dental problem, ear pain, nosebleeds, rhinorrhea, sneezing, sore throat and trouble swallowing.   Eyes: Negative for redness and itching.  Respiratory: Positive for cough and shortness of breath. Negative for chest tightness and wheezing.   Cardiovascular: Negative for palpitations and leg swelling.  Gastrointestinal: Negative for nausea and vomiting.  Genitourinary: Negative for dysuria.  Musculoskeletal: Negative for joint swelling.  Skin: Negative for rash.   Neurological: Positive for headaches.  Hematological: Does not bruise/bleed easily.  Psychiatric/Behavioral: Negative for dysphoric mood. The patient is not nervous/anxious.        Objective:   Physical Exam  Wt Readings from Last 3 Encounters:  12/26/12 239 lb (108.41 kg)  12/23/12 238 lb (107.956 kg)  01/18/11 230 lb 9.6 oz (104.599 kg)     HEENT: nl dentition, turbinates, and orophanx. Nl external ear canals without cough reflex   NECK :  without JVD/Nodes/TM/ nl carotid upstrokes bilaterally   LUNGS: no acc muscle use, clear to A and P bilaterally without cough on insp or exp maneuvers   CV:  RRR  no s3 or murmur or increase in P2, no edema   ABD:  soft and nontender with nl excursion in the supine position. No bruits or organomegaly, bowel sounds nl  MS:  warm without deformities, calf tenderness, cyanosis or clubbing  SKIN: warm and dry without lesions    NEURO:  alert, approp, no deficits    CTa  Small peripheral PE s PAH viz      Assessment & Plan:

## 2012-12-27 NOTE — Assessment & Plan Note (Signed)
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

## 2012-12-29 ENCOUNTER — Ambulatory Visit
Admission: RE | Admit: 2012-12-29 | Discharge: 2012-12-29 | Disposition: A | Discharge status: Home / Self Care | Payer: PRIVATE HEALTH INSURANCE | Source: Ambulatory Visit | Attending: Family Medicine | Admitting: Family Medicine

## 2012-12-29 DIAGNOSIS — E041 Nontoxic single thyroid nodule: Secondary | ICD-10-CM

## 2013-01-01 ENCOUNTER — Other Ambulatory Visit: Payer: Self-pay | Admitting: Family Medicine

## 2013-01-01 ENCOUNTER — Other Ambulatory Visit: Payer: Self-pay | Admitting: Physician Assistant

## 2013-01-01 DIAGNOSIS — E041 Nontoxic single thyroid nodule: Secondary | ICD-10-CM

## 2013-01-06 ENCOUNTER — Other Ambulatory Visit (HOSPITAL_COMMUNITY): Payer: Self-pay | Admitting: Radiology

## 2013-01-06 ENCOUNTER — Ambulatory Visit (HOSPITAL_COMMUNITY): Payer: PRIVATE HEALTH INSURANCE | Attending: Cardiology | Admitting: Radiology

## 2013-01-06 ENCOUNTER — Encounter: Payer: Self-pay | Admitting: Cardiology

## 2013-01-06 ENCOUNTER — Encounter: Payer: Self-pay | Admitting: Internal Medicine

## 2013-01-06 DIAGNOSIS — I2699 Other pulmonary embolism without acute cor pulmonale: Secondary | ICD-10-CM

## 2013-01-06 DIAGNOSIS — I059 Rheumatic mitral valve disease, unspecified: Secondary | ICD-10-CM | POA: Insufficient documentation

## 2013-01-06 NOTE — Progress Notes (Signed)
Echocardiogram performed.  

## 2013-05-12 ENCOUNTER — Telehealth: Payer: Self-pay | Admitting: Radiology

## 2013-05-12 ENCOUNTER — Other Ambulatory Visit: Payer: Self-pay | Admitting: Physician Assistant

## 2013-05-12 DIAGNOSIS — E041 Nontoxic single thyroid nodule: Secondary | ICD-10-CM

## 2013-05-12 NOTE — Telephone Encounter (Signed)
Left message on H#.  Need to schedule thyroid biopsy.  Ardyce Heyer Riki Rusk, RN 05/12/2013 1:27 PM

## 2013-05-19 ENCOUNTER — Other Ambulatory Visit (HOSPITAL_COMMUNITY)
Admission: RE | Admit: 2013-05-19 | Discharge: 2013-05-19 | Disposition: A | Discharge status: Home / Self Care | Payer: PRIVATE HEALTH INSURANCE | Source: Ambulatory Visit | Attending: Interventional Radiology | Admitting: Interventional Radiology

## 2013-05-19 ENCOUNTER — Ambulatory Visit
Admission: RE | Admit: 2013-05-19 | Discharge: 2013-05-19 | Disposition: A | Discharge status: Home / Self Care | Payer: PRIVATE HEALTH INSURANCE | Source: Ambulatory Visit | Attending: Physician Assistant | Admitting: Physician Assistant

## 2013-05-19 ENCOUNTER — Ambulatory Visit
Admission: RE | Admit: 2013-05-19 | Discharge: 2013-05-19 | Disposition: A | Discharge status: Home / Self Care | Payer: PRIVATE HEALTH INSURANCE | Source: Ambulatory Visit | Attending: Family Medicine | Admitting: Family Medicine

## 2013-05-19 ENCOUNTER — Other Ambulatory Visit: Payer: Self-pay | Admitting: Physician Assistant

## 2013-05-19 DIAGNOSIS — E041 Nontoxic single thyroid nodule: Secondary | ICD-10-CM

## 2013-09-15 ENCOUNTER — Other Ambulatory Visit: Payer: Self-pay | Admitting: Otolaryngology

## 2013-09-17 ENCOUNTER — Encounter (HOSPITAL_COMMUNITY): Payer: Self-pay | Admitting: Pharmacy Technician

## 2013-09-18 ENCOUNTER — Inpatient Hospital Stay (HOSPITAL_COMMUNITY): Admission: RE | Admit: 2013-09-18 | Payer: PRIVATE HEALTH INSURANCE | Source: Ambulatory Visit

## 2013-09-22 NOTE — Pre-Procedure Instructions (Signed)
Shandon Burlingame  09/22/2013   Your procedure is scheduled on:  Thurs, Aug 13 @ 7:30 AM  Report to [facility]  at 5:30 AM.  Call this number if you have problems the morning of surgery: [phone]   Remember:   Do not eat food or drink liquids after midnight.   Take these medicines the morning of surgery with A SIP OF WATER: Allegra(Fexofenadine),Nasonex(Mometasone),and Omeprazole(Prilosec)               Stop taking your Fish Oil and Xarelto. No Goody's,BC's,Aleve,Aspirin,Ibuprofen,or any Herbal Medications   Do not wear jewelry  Do not wear lotions, powders, or colognes. You may wear deodorant.  Men may shave face and neck.  Do not bring valuables to the hospital.  [facility] is not responsible                  for any belongings or valuables.               Contacts, dentures or bridgework may not be worn into surgery.  Leave suitcase in the car. After surgery it may be brought to your room.  For patients admitted to the hospital, discharge time is determined by your                treatment team.               Patients discharged the day of surgery will not be allowed to drive  home.    Special Instructions:  [facility] - Preparing for Surgery  Before surgery, you can play an important role.  Because skin is not sterile, your skin needs to be as free of germs as possible.  You can reduce the number of germs on you skin by washing with CHG (chlorahexidine gluconate) soap before surgery.  CHG is an antiseptic cleaner which kills germs and bonds with the skin to continue killing germs even after washing.  Please DO NOT use if you have an allergy to CHG or antibacterial soaps.  If your skin becomes reddened/irritated stop using the CHG and inform your nurse when you arrive at Short Stay.  Do not shave (including legs and underarms) for at least 48 hours prior to the first CHG shower.  You may shave your face.  Please follow these instructions carefully:   1.  Shower with  CHG Soap the night before surgery and the                                morning of Surgery.  2.  If you choose to wash your hair, wash your hair first as usual with your       normal shampoo.  3.  After you shampoo, rinse your hair and body thoroughly to remove the                      Shampoo.  4.  Use CHG as you would any other liquid soap.  You can apply chg directly       to the skin and wash gently with scrungie or a clean washcloth.  5.  Apply the CHG Soap to your body ONLY FROM THE NECK DOWN.        Do not use on open wounds or open sores.  Avoid contact with your eyes,       ears, mouth and genitals (private parts).  Wash  genitals (private parts)       with your normal soap.  6.  Wash thoroughly, paying special attention to the area where your surgery        will be performed.  7.  Thoroughly rinse your body with warm water from the neck down.  8.  DO NOT shower/wash with your normal soap after using and rinsing off       the CHG Soap.  9.  Pat yourself dry with a clean towel.            10.  Wear clean pajamas.            11.  Place clean sheets on your bed the night of your first shower and do not        sleep with pets.  Day of Surgery  Do not apply any lotions/deoderants the morning of surgery.  Please wear clean clothes to the hospital/surgery center.     Please read over the following fact sheets that you were given: Pain Booklet, Coughing and Deep Breathing and Surgical Site Infection Prevention

## 2013-09-23 ENCOUNTER — Encounter (HOSPITAL_COMMUNITY)
Admission: RE | Admit: 2013-09-23 | Discharge: 2013-09-23 | Disposition: A | Discharge status: Home / Self Care | Payer: PRIVATE HEALTH INSURANCE | Source: Ambulatory Visit | Attending: Otolaryngology | Admitting: Otolaryngology

## 2013-09-23 ENCOUNTER — Encounter (HOSPITAL_COMMUNITY)
Admission: RE | Admit: 2013-09-23 | Discharge: 2013-09-23 | Disposition: A | Discharge status: Home / Self Care | Payer: PRIVATE HEALTH INSURANCE | Source: Ambulatory Visit | Attending: Anesthesiology | Admitting: Anesthesiology

## 2013-09-23 ENCOUNTER — Encounter (HOSPITAL_COMMUNITY): Payer: Self-pay

## 2013-09-23 DIAGNOSIS — Z01818 Encounter for other preprocedural examination: Secondary | ICD-10-CM | POA: Diagnosis not present

## 2013-09-23 DIAGNOSIS — I10 Essential (primary) hypertension: Secondary | ICD-10-CM | POA: Insufficient documentation

## 2013-09-23 DIAGNOSIS — Z01812 Encounter for preprocedural laboratory examination: Secondary | ICD-10-CM | POA: Insufficient documentation

## 2013-09-23 HISTORY — DX: Personal history of other venous thrombosis and embolism: Z86.718

## 2013-09-23 HISTORY — DX: Family history of other specified conditions: Z84.89

## 2013-09-23 HISTORY — DX: Other seasonal allergic rhinitis: J30.2

## 2013-09-23 HISTORY — DX: Nausea with vomiting, unspecified: R11.2

## 2013-09-23 HISTORY — DX: Pain in unspecified joint: M25.50

## 2013-09-23 HISTORY — DX: Personal history of colon polyps, unspecified: Z86.0100

## 2013-09-23 HISTORY — DX: Personal history of colonic polyps: Z86.010

## 2013-09-23 HISTORY — DX: Headache: R51

## 2013-09-23 HISTORY — DX: Personal history of urinary calculi: Z87.442

## 2013-09-23 HISTORY — DX: Other specified postprocedural states: Z98.890

## 2013-09-23 HISTORY — DX: Unspecified osteoarthritis, unspecified site: M19.90

## 2013-09-23 LAB — CBC
HCT: 40.9 % (ref 39.0–52.0)
HEMOGLOBIN: 13.8 g/dL (ref 13.0–17.0)
MCH: 30.5 pg (ref 26.0–34.0)
MCHC: 33.7 g/dL (ref 30.0–36.0)
MCV: 90.3 fL (ref 78.0–100.0)
Platelets: 186 10*3/uL (ref 150–400)
RBC: 4.53 MIL/uL (ref 4.22–5.81)
RDW: 12.7 % (ref 11.5–15.5)
WBC: 6.4 10*3/uL (ref 4.0–10.5)

## 2013-09-23 LAB — BASIC METABOLIC PANEL
Anion gap: 13 (ref 5–15)
BUN: 10 mg/dL (ref 6–23)
CHLORIDE: 102 meq/L (ref 96–112)
CO2: 25 meq/L (ref 19–32)
Calcium: 8.9 mg/dL (ref 8.4–10.5)
Creatinine, Ser: 1.06 mg/dL (ref 0.50–1.35)
GFR calc Af Amer: 86 mL/min — ABNORMAL LOW (ref >90)
GFR calc non Af Amer: 74 mL/min — ABNORMAL LOW (ref >90)
GLUCOSE: 112 mg/dL — AB (ref 70–99)
POTASSIUM: 4 meq/L (ref 3.7–5.3)
Sodium: 140 mEq/L (ref 137–147)

## 2013-09-23 NOTE — Progress Notes (Signed)
09/23/13 1027  OBSTRUCTIVE SLEEP APNEA  Have you ever been diagnosed with sleep apnea through a sleep study? No  Do you snore loudly (loud enough to be heard through closed doors)?  0  Do you often feel tired, fatigued, or sleepy during the daytime? 0  Has anyone observed you stop breathing during your sleep? 0  Do you have, or are you being treated for high blood pressure? 1  BMI more than 35 kg/m2? 0  Age over 61 years old? 1  Neck circumference greater than 40 cm/16 inches? 1  Gender: 1  Obstructive Sleep Apnea Score 4  Score 4 or greater  Results sent to PCP

## 2013-09-23 NOTE — Progress Notes (Addendum)
  Saw a cardiologist and pulmonologist last yr after blood clot and no further follow up needed  Denies ever having a stress test/heart cath  Echo report in epic from 2014  EKG in epic from 12-23-12  Medical Md is with [facility] on [address] in epic from 12/2012

## 2013-09-24 ENCOUNTER — Encounter (HOSPITAL_COMMUNITY): Payer: PRIVATE HEALTH INSURANCE | Admitting: Certified Registered Nurse Anesthetist

## 2013-09-24 ENCOUNTER — Encounter (HOSPITAL_COMMUNITY)
Admission: RE | Disposition: A | Discharge status: Home / Self Care | Payer: Self-pay | Source: Ambulatory Visit | Attending: Otolaryngology

## 2013-09-24 ENCOUNTER — Ambulatory Visit (HOSPITAL_COMMUNITY): Payer: PRIVATE HEALTH INSURANCE | Admitting: Certified Registered Nurse Anesthetist

## 2013-09-24 ENCOUNTER — Ambulatory Visit (HOSPITAL_COMMUNITY)
Admission: RE | Admit: 2013-09-24 | Discharge: 2013-09-24 | Disposition: A | Discharge status: Home / Self Care | Payer: PRIVATE HEALTH INSURANCE | Source: Ambulatory Visit | Attending: Otolaryngology | Admitting: Otolaryngology

## 2013-09-24 DIAGNOSIS — Z5309 Procedure and treatment not carried out because of other contraindication: Secondary | ICD-10-CM | POA: Diagnosis not present

## 2013-09-24 DIAGNOSIS — K219 Gastro-esophageal reflux disease without esophagitis: Secondary | ICD-10-CM | POA: Insufficient documentation

## 2013-09-24 DIAGNOSIS — Z8546 Personal history of malignant neoplasm of prostate: Secondary | ICD-10-CM | POA: Diagnosis not present

## 2013-09-24 DIAGNOSIS — I10 Essential (primary) hypertension: Secondary | ICD-10-CM | POA: Diagnosis not present

## 2013-09-24 DIAGNOSIS — M109 Gout, unspecified: Secondary | ICD-10-CM | POA: Insufficient documentation

## 2013-09-24 DIAGNOSIS — Z79899 Other long term (current) drug therapy: Secondary | ICD-10-CM | POA: Diagnosis not present

## 2013-09-24 DIAGNOSIS — Z86718 Personal history of other venous thrombosis and embolism: Secondary | ICD-10-CM | POA: Diagnosis not present

## 2013-09-24 DIAGNOSIS — E079 Disorder of thyroid, unspecified: Secondary | ICD-10-CM | POA: Diagnosis present

## 2013-09-24 SURGERY — THYROIDECTOMY
Anesthesia: General | Laterality: Left

## 2013-09-24 MED ORDER — LACTATED RINGERS IV SOLN
INTRAVENOUS | Status: AC | PRN
  Administered 2013-09-24: 07:00:00 via INTRAVENOUS

## 2013-09-24 MED ORDER — ROCURONIUM BROMIDE 50 MG/5ML IV SOLN
INTRAVENOUS | Status: AC
  Filled 2013-09-24: qty 1

## 2013-09-24 MED ORDER — PROPOFOL 10 MG/ML IV BOLUS
INTRAVENOUS | Status: AC
  Filled 2013-09-24: qty 20

## 2013-09-24 MED ORDER — LIDOCAINE HCL (CARDIAC) 20 MG/ML IV SOLN
INTRAVENOUS | Status: AC
  Filled 2013-09-24: qty 5

## 2013-09-24 MED ORDER — MIDAZOLAM HCL 2 MG/2ML IJ SOLN
INTRAMUSCULAR | Status: AC
  Filled 2013-09-24: qty 2

## 2013-09-24 MED ORDER — SUCCINYLCHOLINE CHLORIDE 20 MG/ML IJ SOLN
INTRAMUSCULAR | Status: AC
  Filled 2013-09-24: qty 1

## 2013-09-24 MED ORDER — FENTANYL CITRATE 0.05 MG/ML IJ SOLN
INTRAMUSCULAR | Status: AC
  Filled 2013-09-24: qty 5

## 2013-09-24 MED ORDER — ARTIFICIAL TEARS OP OINT
TOPICAL_OINTMENT | OPHTHALMIC | Status: AC
Start: 2013-09-24 — End: 2013-09-24
  Filled 2013-09-24: qty 3.5

## 2013-09-24 MED ORDER — ONDANSETRON HCL 4 MG/2ML IJ SOLN
INTRAMUSCULAR | Status: AC
  Filled 2013-09-24: qty 2

## 2013-09-24 SURGICAL SUPPLY — 45 items
APPLIER CLIP 9.375 SM OPEN (CLIP) ×3
ATTRACTOMAT 16X20 MAGNETIC DRP (DRAPES) IMPLANT
BLADE 10 SAFETY STRL DISP (BLADE) ×3 IMPLANT
BLADE 15 SAFETY STRL DISP (BLADE) IMPLANT
BLADE SURG ROTATE 9660 (MISCELLANEOUS) IMPLANT
CANISTER SUCTION 2500CC (MISCELLANEOUS) ×3 IMPLANT
CLEANER TIP ELECTROSURG 2X2 (MISCELLANEOUS) ×3 IMPLANT
CLIP APPLIE 9.375 SM OPEN (CLIP) ×1 IMPLANT
CONT SPEC 4OZ CLIKSEAL STRL BL (MISCELLANEOUS) ×6 IMPLANT
CORDS BIPOLAR (ELECTRODE) ×3 IMPLANT
COVER SURGICAL LIGHT HANDLE (MISCELLANEOUS) ×3 IMPLANT
CRADLE DONUT ADULT HEAD (MISCELLANEOUS) IMPLANT
DERMABOND ADVANCED (GAUZE/BANDAGES/DRESSINGS)
DERMABOND ADVANCED .7 DNX12 (GAUZE/BANDAGES/DRESSINGS) IMPLANT
DRAIN JACKSON RD 7FR 3/32 (WOUND CARE) ×3 IMPLANT
ELECT COATED BLADE 2.86 ST (ELECTRODE) ×3 IMPLANT
ELECT REM PT RETURN 9FT ADLT (ELECTROSURGICAL) ×3
ELECTRODE REM PT RTRN 9FT ADLT (ELECTROSURGICAL) ×1 IMPLANT
EVACUATOR SILICONE 100CC (DRAIN) ×3 IMPLANT
GAUZE SPONGE 4X4 16PLY XRAY LF (GAUZE/BANDAGES/DRESSINGS) ×3 IMPLANT
GLOVE ECLIPSE 7.5 STRL STRAW (GLOVE) ×3 IMPLANT
GOWN STRL REUS W/ TWL LRG LVL3 (GOWN DISPOSABLE) ×2 IMPLANT
GOWN STRL REUS W/TWL LRG LVL3 (GOWN DISPOSABLE) ×4
KIT BASIN OR (CUSTOM PROCEDURE TRAY) ×3 IMPLANT
KIT ROOM TURNOVER OR (KITS) ×3 IMPLANT
LOCATOR NERVE 3 VOLT (DISPOSABLE) IMPLANT
NS IRRIG 1000ML POUR BTL (IV SOLUTION) ×3 IMPLANT
PAD ARMBOARD 7.5X6 YLW CONV (MISCELLANEOUS) ×6 IMPLANT
PENCIL FOOT CONTROL (ELECTRODE) ×3 IMPLANT
SPONGE INTESTINAL PEANUT (DISPOSABLE) IMPLANT
STAPLER VISISTAT 35W (STAPLE) ×3 IMPLANT
SUT CHROMIC 4 0 PS 2 18 (SUTURE) ×6 IMPLANT
SUT ETHILON 3 0 PS 1 (SUTURE) ×3 IMPLANT
SUT SILK 2 0 (SUTURE) ×2
SUT SILK 2-0 18XBRD TIE 12 (SUTURE) ×1 IMPLANT
SUT SILK 4 0 (SUTURE) ×2
SUT SILK 4-0 18XBRD TIE 12 (SUTURE) ×1 IMPLANT
TOWEL OR 17X24 6PK STRL BLUE (TOWEL DISPOSABLE) ×3 IMPLANT
TOWEL OR 17X26 10 PK STRL BLUE (TOWEL DISPOSABLE) ×3 IMPLANT
TRAY ENT MC OR (CUSTOM PROCEDURE TRAY) ×3 IMPLANT
TRAY FOLEY CATH 14FR (SET/KITS/TRAYS/PACK) IMPLANT
TUBE ENDOTRAC EMG 7X10.2 (MISCELLANEOUS) IMPLANT
TUBE ENDOTRAC EMG 8X11.3 (MISCELLANEOUS) IMPLANT
TUBE ENDOTRACH  EMG 6MMTUBE EN (MISCELLANEOUS)
TUBE ENDOTRACH EMG 6MMTUBE EN (MISCELLANEOUS) IMPLANT

## 2013-09-24 NOTE — Progress Notes (Signed)
Call to Dr. Marcie Bal, report of pt. with PE history. Also pt. On Xarelto until yesterday.

## 2013-09-24 NOTE — Progress Notes (Signed)
Turns out he is only off the Xarelto for less than 48 hours. It is too risky for thyroid surgery with this situation and even 2 days is questionable. I think a change in meds so there is more consistency in the coag stoppage time is in order.  I will again discuss with his doctor and see if a change is possible for this surgery. The patient also does not like Xarelto and has had issues with the processes and concerns for the drug. He is cancelled today.

## 2013-09-24 NOTE — H&P (Signed)
Joshua Young is an 63 y.o. male.   Chief Complaint: left thyroid mass HPI: he has hx of left thryoid mass and after discussion of observation he does not want any further medical workup. He wants the mass out. He has a clotting disorder and his doctor would only allow 2 days off the Xarelto.  Past Medical History  Diagnosis Date  . Prostate cancer 04/07/10    gleason 8, volume 30.069cc  . Gout     tried taking Allopurinol but decided not to take it  . DVT, lower extremity   . S/P radiation therapy 4-12 wks ago 9/12-10-12    prostate  . Hypertension     takes Losartan daily  . GERD (gastroesophageal reflux disease)     takes Omeprazole daily  . Seasonal allergies     takes Allegra daily as needed as well as using Nasonex as needed  . PONV (postoperative nausea and vomiting)   . Family history of anesthesia complication     dementia after anesthesia with an uncle  . History of blood clots     right calf(2008) and last yr in left leg put on Xarelto and went into right lung  . Headache(784.0)     occasionally  . Arthritis   . Joint pain   . History of colon polyps   . History of kidney stones early 2's    Past Surgical History  Procedure Laterality Date  . Vasectomy    . Prostatecomy      complete  . Colonoscopy      Family History  Problem Relation Age of Onset  . Cancer Father     CLL   Social History:  reports that he has never smoked. He does not have any smokeless tobacco history on file. He reports that he does not drink alcohol or use illicit drugs.  Allergies: No Known Allergies  Medications Prior to Admission  Medication Sig Dispense Refill  . losartan (COZAAR) 50 MG tablet Take 50 mg by mouth daily.       . mometasone (NASONEX) 50 MCG/ACT nasal spray Place 2 sprays into the nose daily as needed (for congestion).       . Multiple Vitamins-Minerals (CENTRUM SILVER PO) Take 1 tablet by mouth daily.       Marland Kitchen omeprazole (PRILOSEC) 20 MG capsule Take 20 mg by  mouth daily.       . rivaroxaban (XARELTO) 20 MG TABS tablet Take 20 mg by mouth daily with supper.      . tadalafil (CIALIS) 5 MG tablet Take 5 mg by mouth daily as needed for erectile dysfunction.       . vitamin C (ASCORBIC ACID) 500 MG tablet Take 500 mg by mouth daily.       . fexofenadine (ALLEGRA) 180 MG tablet Take 180 mg by mouth daily as needed for allergies.       . fish oil-omega-3 fatty acids 1000 MG capsule Take 1 g by mouth daily.         Results for orders placed during the hospital encounter of 09/23/13 (from the past 48 hour(s))  BASIC METABOLIC PANEL     Status: Abnormal   Collection Time    09/23/13 10:42 AM      Result Value Ref Range   Sodium 140  137 - 147 mEq/L   Potassium 4.0  3.7 - 5.3 mEq/L   Chloride 102  96 - 112 mEq/L   CO2 25  19 - 32 mEq/L  Glucose, Bld 112 (*) 70 - 99 mg/dL   BUN 10  6 - 23 mg/dL   Creatinine, Ser 1.06  0.50 - 1.35 mg/dL   Calcium 8.9  8.4 - 10.5 mg/dL   GFR calc non Af Amer 74 (*) >90 mL/min   GFR calc Af Amer 86 (*) >90 mL/min   Comment: (NOTE)     The eGFR has been calculated using the CKD EPI equation.     This calculation has not been validated in all clinical situations.     eGFR's persistently <90 mL/min signify possible Chronic Kidney     Disease.   Anion gap 13  5 - 15  CBC     Status: None   Collection Time    09/23/13 10:42 AM      Result Value Ref Range   WBC 6.4  4.0 - 10.5 K/uL   RBC 4.53  4.22 - 5.81 MIL/uL   Hemoglobin 13.8  13.0 - 17.0 g/dL   HCT 40.9  39.0 - 52.0 %   MCV 90.3  78.0 - 100.0 fL   MCH 30.5  26.0 - 34.0 pg   MCHC 33.7  30.0 - 36.0 g/dL   RDW 12.7  11.5 - 15.5 %   Platelets 186  150 - 400 K/uL   Dg Chest 2 View  09/23/2013   CLINICAL DATA:  Preoperative evaluation; hypertension  EXAM: CHEST  2 VIEW  COMPARISON:  Chest radiograph June 07, 2010 and chest CT December 23, 2012  FINDINGS: There is no edema or consolidation. Heart size and pulmonary vascularity are within normal limits. No  adenopathy. Trachea is midline. There is mild degenerative change in the thoracic spine.  IMPRESSION: No edema or consolidation.   Electronically Signed   By: Lowella Grip M.D.   On: 09/23/2013 13:17    Review of Systems  Constitutional: Negative.   HENT: Negative.   Eyes: Negative.   Respiratory: Negative.   Cardiovascular: Negative.   Musculoskeletal: Negative.   Skin: Negative.     Blood pressure 143/80, pulse 61, temperature 97.6 F (36.4 C), temperature source Oral, resp. rate 17, height _0  (1.727 m), weight 110.224 kg (243 lb), SpO2 97.00%. Physical Exam  Constitutional: He appears well-developed and well-nourished.  HENT:  Nose: Nose normal.  Mouth/Throat: Oropharynx is clear and moist.  Eyes: Conjunctivae are normal. Pupils are equal, round, and reactive to light.  Neck: Normal range of motion. Neck supple.  Cardiovascular: Normal rate.   Respiratory: Effort normal.  GI: Soft.  Musculoskeletal: Normal range of motion.     Assessment/Plan Left thyroid mass- ready to proceed with surgery which was discussed.  He understands the risk of more clotting despite the precautions.   Melissa Montane 09/24/2013, 7:11 AM

## 2013-09-24 NOTE — Anesthesia Preprocedure Evaluation (Signed)
Anesthesia Evaluation  Patient identified by MRN, date of birth, ID band Patient awake    Reviewed: Allergy & Precautions, H&P , NPO status   Airway Mallampati: II TM Distance: >3 FB Neck ROM: Full    Dental  (+) Teeth Intact   Pulmonary  breath sounds clear to auscultation        Cardiovascular hypertension, Rhythm:Regular Rate:Normal     Neuro/Psych    GI/Hepatic   Endo/Other    Renal/GU      Musculoskeletal   Abdominal   Peds  Hematology   Anesthesia Other Findings   Reproductive/Obstetrics                           Anesthesia Physical Anesthesia Plan  ASA: III  Anesthesia Plan: General   Post-op Pain Management:    Induction: Intravenous  Airway Management Planned: Oral ETT  Additional Equipment:   Intra-op Plan:   Post-operative Plan: Extubation in OR  Informed Consent: I have reviewed the patients History and Physical, chart, labs and discussed the procedure including the risks, benefits and alternatives for the proposed anesthesia with the patient or authorized representative who has indicated his/her understanding and acceptance.   Dental advisory given  Plan Discussed with: CRNA and Anesthesiologist  Anesthesia Plan Comments: (Thyroid mass Hypertension H/O DVT and PE on Xarelto last took on 8/11 H/O PONV  Plan GA with oral ETT    Roberts Gaudy)        Anesthesia Quick Evaluation

## 2013-10-07 ENCOUNTER — Other Ambulatory Visit: Payer: Self-pay | Admitting: Otolaryngology

## 2013-10-07 DIAGNOSIS — D34 Benign neoplasm of thyroid gland: Secondary | ICD-10-CM

## 2013-10-12 ENCOUNTER — Ambulatory Visit
Admission: RE | Admit: 2013-10-12 | Discharge: 2013-10-12 | Disposition: A | Discharge status: Home / Self Care | Payer: PRIVATE HEALTH INSURANCE | Source: Ambulatory Visit | Attending: Otolaryngology | Admitting: Otolaryngology

## 2013-10-12 DIAGNOSIS — D34 Benign neoplasm of thyroid gland: Secondary | ICD-10-CM

## 2013-10-13 ENCOUNTER — Telehealth: Payer: Self-pay | Admitting: Hematology and Oncology

## 2013-10-13 NOTE — Telephone Encounter (Signed)
S/W PATIENT AND GAVE NP APPT FOR 09/15 @ 11 W/DR. [place]

## 2013-10-27 ENCOUNTER — Encounter: Payer: Self-pay | Admitting: Hematology and Oncology

## 2013-10-27 ENCOUNTER — Ambulatory Visit (HOSPITAL_BASED_OUTPATIENT_CLINIC_OR_DEPARTMENT_OTHER): Payer: PRIVATE HEALTH INSURANCE | Admitting: Hematology and Oncology

## 2013-10-27 ENCOUNTER — Ambulatory Visit: Payer: PRIVATE HEALTH INSURANCE

## 2013-10-27 ENCOUNTER — Encounter (INDEPENDENT_AMBULATORY_CARE_PROVIDER_SITE_OTHER): Payer: Self-pay

## 2013-10-27 VITALS — BP 149/75 | HR 72 | Temp 97.7°F | Resp 18 | Ht 68.0 in | Wt 239.0 lb

## 2013-10-27 DIAGNOSIS — I82409 Acute embolism and thrombosis of unspecified deep veins of unspecified lower extremity: Secondary | ICD-10-CM

## 2013-10-27 DIAGNOSIS — Z23 Encounter for immunization: Secondary | ICD-10-CM

## 2013-10-27 DIAGNOSIS — Z7901 Long term (current) use of anticoagulants: Secondary | ICD-10-CM

## 2013-10-27 DIAGNOSIS — I2699 Other pulmonary embolism without acute cor pulmonale: Secondary | ICD-10-CM

## 2013-10-27 MED ORDER — INFLUENZA VAC SPLIT QUAD 0.5 ML IM SUSY
0.5000 mL | PREFILLED_SYRINGE | Freq: Once | INTRAMUSCULAR | Status: AC
  Administered 2013-10-27: 0.5 mL via INTRAMUSCULAR
  Filled 2013-10-27: qty 0.5

## 2013-10-27 NOTE — Progress Notes (Signed)
[facility] NOTE  Patient Care Team: Candace Wyline Copas, MD as PCP - General (Family Medicine) Heath Lark, MD as Consulting Physician (Hematology and Oncology)  CHIEF COMPLAINTS/PURPOSE OF CONSULTATION:  Recurrent DVT and PE  HISTORY OF PRESENTING ILLNESS:  Joshua Young 63 y.o. male is here because of diagnosis of recurrent DVT and PE The patient is an excellent historian.  In 2008, the patient had a sudden onset of right calf swelling. Ultrasound venous Doppler dated 08/12/2006 showed blood clot at the distal right popliteal vein, with extension into the mid and distal calf veins. Specifically, there appears to be thrombus in at least one posterior tibial vein and likely within both peroneal veins.  According to the patient, he does not think the blood clot was provoked. 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 He was anticoagulated with warfarin after overlapped with Lovenox. On 10/21/2006, he had repeat ultrasound venous Doppler which showed interval decrease in the burden of thrombus in the right lower extremity deep  venous system posterior to the knee and in the upper calf. There may be a small amount of residual thrombus within a peroneal vein, but this is not a definite finding. He completed 6 months worth of anticoagulation therapy without bleeding complications. On 04/22/2007, repeat venous Doppler show complete resolution of his blood clot.  In 2011, routine primary followup detected elevated PSA. He was referred to see a urologist and subsequently underwent prostatectomy with lymph node biopsy. Final staging was T3, N0, M0. He underwent adjuvant radiation therapy with undetectable PSA since then. He denies postoperative thromboembolism.  Around October 2014, he had a long distance travel to [place] over 8 hours. At that point in time, the patient was taking aspirin 325 mg and multiple frequent  stops. The following day, he complained of pain in the left calf. On 12/09/2012, venous Doppler showed findings consistent with acute deep vein thrombosis involving the left popliteal, posterior tibial, and peroneal veins. The patient was placed on Xarelto. On 12/23/2012, he started to have acute onset of shortness of breath and dizziness. CT angiogram showed small pulmonary emboli on the right, as described above. 2. 2 probable subpleural lymph nodes on the right, as described above. Echocardiogram on 01/06/2013 show borderline pulmonary hypertension. There was an enlarged thyroid nodule. He remained on anticoagulation therapy. He was subsequently referred to ENT for evaluation of an enlarged thyroid nodule. On 05/19/2013, fine-needle aspirate of the thyroid nodule was inconclusive. He declined thyroidectomy due to the blood clot issue.  Currently, he is doing well on anticoagulation therapy. He denies lower extremity swelling, warmth, tenderness & erythema.  He denies recent chest pain on exertion, shortness of breath on minimal exertion, pre-syncopal episodes, hemoptysis, or palpitation. His age appropriate screening programs are up-to-date. He had prior surgeries before and never had perioperative thromboembolic events. The patient had never been on testosterone replacement therapy. There is no family history of blood clots or miscarriages.  MEDICAL HISTORY:  Past Medical History  Diagnosis Date  . Prostate cancer 04/07/10    gleason 8, volume 30.069cc  . Gout     tried taking Allopurinol but decided not to take it  . DVT, lower extremity   . S/P radiation therapy 4-12 wks ago 9/12-10-12    prostate  . Hypertension     takes Losartan daily  . GERD (gastroesophageal reflux disease)     takes Omeprazole daily  . Seasonal allergies  takes Allegra daily as needed as well as using Nasonex as needed  . PONV (postoperative nausea and vomiting)   . Family history of anesthesia complication      dementia after anesthesia with an uncle  . History of blood clots     right calf(2008) and last yr in left leg put on Xarelto and went into right lung  . Headache(784.0)     occasionally  . Arthritis   . Joint pain   . History of colon polyps   . History of kidney stones early 38's    SURGICAL HISTORY: Past Surgical History  Procedure Laterality Date  . Vasectomy    . Prostatecomy      complete  . Colonoscopy      SOCIAL HISTORY: History   Social History  . Marital Status: Married    Spouse Name: N/A    Number of Children: N/A  . Years of Education: N/A   Occupational History  . Not on file.   Social History Main Topics  . Smoking status: Never Smoker   . Smokeless tobacco: Never Used  . Alcohol Use: No  . Drug Use: No  . Sexual Activity: Yes   Other Topics Concern  . Not on file   Social History Narrative  . No narrative on file    FAMILY HISTORY: Family History  Problem Relation Age of Onset  . Cancer Father     CLL    ALLERGIES:  has No Known Allergies.  MEDICATIONS:  Current Outpatient Prescriptions  Medication Sig Dispense Refill  . fexofenadine (ALLEGRA) 180 MG tablet Take 180 mg by mouth daily as needed for allergies.       Marland Kitchen losartan (COZAAR) 50 MG tablet Take 50 mg by mouth daily.       . mometasone (NASONEX) 50 MCG/ACT nasal spray Place 2 sprays into the nose daily as needed (for congestion).       . Multiple Vitamins-Minerals (CENTRUM SILVER PO) Take 1 tablet by mouth daily.       Marland Kitchen omeprazole (PRILOSEC) 20 MG capsule Take 20 mg by mouth daily.       . rivaroxaban (XARELTO) 20 MG TABS tablet Take 20 mg by mouth daily with supper.      . vitamin C (ASCORBIC ACID) 500 MG tablet Take 500 mg by mouth daily.        No current facility-administered medications for this visit.   Facility-Administered Medications Ordered in Other Visits  Medication Dose Route Frequency Provider Last Rate Last Dose  . lactated ringers infusion    Continuous  PRN Rogers Blocker, CRNA        REVIEW OF SYSTEMS:   Constitutional: Denies fevers, chills or abnormal night sweats Eyes: Denies blurriness of vision, double vision or watery eyes Ears, nose, mouth, throat, and face: Denies mucositis or sore throat Respiratory: Denies cough, dyspnea or wheezes Cardiovascular: Denies palpitation, chest discomfort or lower extremity swelling Gastrointestinal:  Denies nausea, heartburn or change in bowel habits Skin: Denies abnormal skin rashes Lymphatics: Denies new lymphadenopathy or easy bruising Neurological:Denies numbness, tingling or new weaknesses Behavioral/Psych: Mood is stable, no new changes  All other systems were reviewed with the patient and are negative.  PHYSICAL EXAMINATION: ECOG PERFORMANCE STATUS: 0 - Asymptomatic  Filed Vitals:   10/27/13 1103  BP: 149/75  Pulse: 72  Temp: 97.7 F (36.5 C)  Resp: 18   Filed Weights   10/27/13 1103  Weight: 239 lb (108.41 kg)  GENERAL:alert, no distress and comfortable SKIN: skin color, texture, turgor are normal, no rashes or significant lesions EYES: normal, conjunctiva are pink and non-injected, sclera clear OROPHARYNX:no exudate, no erythema and lips, buccal mucosa, and tongue normal  NECK: supple, thyroid normal size, non-tender, without nodularity LYMPH:  no palpable lymphadenopathy in the cervical, axillary or inguinal LUNGS: clear to auscultation and percussion with normal breathing effort HEART: regular rate & rhythm and no murmurs and no lower extremity edema. The last compression hose are in place. Mild varicose veins are noted. ABDOMEN:abdomen soft, non-tender and normal bowel sounds Musculoskeletal:no cyanosis of digits and no clubbing  PSYCH: alert & oriented x 3 with fluent speech NEURO: no focal motor/sensory deficits  LABORATORY DATA:  I have reviewed the data as listed Recent Results (from the past 2160 hour(s))  BASIC METABOLIC PANEL     Status: Abnormal    Collection Time    09/23/13 10:42 AM      Result Value Ref Range   Sodium 140  137 - 147 mEq/L   Potassium 4.0  3.7 - 5.3 mEq/L   Chloride 102  96 - 112 mEq/L   CO2 25  19 - 32 mEq/L   Glucose, Bld 112 (*) 70 - 99 mg/dL   BUN 10  6 - 23 mg/dL   Creatinine, Ser 1.06  0.50 - 1.35 mg/dL   Calcium 8.9  8.4 - 10.5 mg/dL   GFR calc non Af Amer 74 (*) >90 mL/min   GFR calc Af Amer 86 (*) >90 mL/min   Comment: (NOTE)     The eGFR has been calculated using the CKD EPI equation.     This calculation has not been validated in all clinical situations.     eGFR's persistently <90 mL/min signify possible Chronic Kidney     Disease.   Anion gap 13  5 - 15  CBC     Status: None   Collection Time    09/23/13 10:42 AM      Result Value Ref Range   WBC 6.4  4.0 - 10.5 K/uL   RBC 4.53  4.22 - 5.81 MIL/uL   Hemoglobin 13.8  13.0 - 17.0 g/dL   HCT 40.9  39.0 - 52.0 %   MCV 90.3  78.0 - 100.0 fL   MCH 30.5  26.0 - 34.0 pg   MCHC 33.7  30.0 - 36.0 g/dL   RDW 12.7  11.5 - 15.5 %   Platelets 186  150 - 400 K/uL    RADIOGRAPHIC STUDIES: I have personally reviewed the radiological images as listed and agreed with the findings in the report of his CT scan from 2014.  ASSESSMENT:  Recurrent DVT/PE  PLAN:  I reviewed with the patient about the plan for care for recurrent DVT/PE.  This last episode of blood clot appeared to be provoked. We discussed about the pros and cons about testing for thrombophilia disorder. his current anticoagulation therapy will interfere with some the tests and it is not possible to interpret the test results.  Taking him off the anticoagulation therapy to do the tests may precipitate another thrombotic event. I do not see a reason to order excessive testing to screen for thrombophilia disorder as it would not change our management.  The goal of anticoagulation therapy is for life.   We discussed about various options of anticoagulation therapies including warfarin, low  molecular weight heparin such as Lovenox or newer agents such as Rivaroxaban. Some of the risks and  benefits discussed including costs involved, the need for monitoring, risks of life-threatening bleeding/hospitalization, reversibility of each agent in the event of bleeding or overdose, safety profile of each drug and taking into account other social issues such as ease of administration of medications, etc. Ultimately, we have made an informed decision for the patient to continue his treatment with Xarelto.  I recommend the patient to use elastic compression stockings at 20-30 mmHg to reduce risks of chronic thrombophlebitis.  Another main issue we discussed today included the role of screening other family members for thrombophilia disorder. At present time, I would not recommend testing the patient's family members as it would not benefit them.  Thrombophilia disorder is a genetic predisposition which increases an individual's risk for a thrombotic event, NOT a disease.  We discussed the implications of genetic screening including the possibility of uninsurability, costs involved, emotional distress and possible discrimination at various levels for the affected individual.  Rather than genetic screening, one can possibly benefit from genetic counseling or dissemination of appropriate reading materials to educate other family members.  Finally, at the end of our consultation today, I reinforced the importance of preventive strategies such as avoiding hormonal supplement, avoiding cigarette smoking, keeping up-to-date with screening programs for early cancer detection, frequent ambulation for long distance travel and aggressive DVT prophylaxis in all surgical settings.  I have not made a return appointment for the patient to come back. I would be happy to assist in perioperative DVT management in the future should he need any interruption of his anticoagulation therapy for elective procedures.  For  possible future thyroidectomy, I can assist in bridging therapy before and after his surgery. The patient will call me if needed.  All questions were answered. The patient knows to call the clinic with any problems, questions or concerns. I spent 40 minutes counseling the patient face to face. The total time spent in the appointment was 60 minutes and more than 50% was on counseling.     [place], [place], MD 10/27/2013 12:30 PM

## 2013-10-27 NOTE — Progress Notes (Signed)
Checked in new pt with no financial concerns. °

## 2014-01-06 ENCOUNTER — Other Ambulatory Visit: Payer: Self-pay | Admitting: Gastroenterology

## 2014-05-27 ENCOUNTER — Other Ambulatory Visit: Payer: Self-pay | Admitting: Otolaryngology

## 2014-05-27 DIAGNOSIS — D34 Benign neoplasm of thyroid gland: Secondary | ICD-10-CM

## 2014-05-31 ENCOUNTER — Ambulatory Visit
Admission: RE | Admit: 2014-05-31 | Discharge: 2014-05-31 | Disposition: A | Discharge status: Home / Self Care | Payer: PRIVATE HEALTH INSURANCE | Source: Ambulatory Visit | Attending: Otolaryngology | Admitting: Otolaryngology

## 2014-05-31 DIAGNOSIS — D34 Benign neoplasm of thyroid gland: Secondary | ICD-10-CM

## 2014-12-07 ENCOUNTER — Other Ambulatory Visit: Payer: Self-pay | Admitting: Otolaryngology

## 2014-12-07 DIAGNOSIS — D34 Benign neoplasm of thyroid gland: Secondary | ICD-10-CM

## 2014-12-10 ENCOUNTER — Ambulatory Visit
Admission: RE | Admit: 2014-12-10 | Discharge: 2014-12-10 | Disposition: A | Discharge status: Home / Self Care | Payer: PRIVATE HEALTH INSURANCE | Source: Ambulatory Visit | Attending: Otolaryngology | Admitting: Otolaryngology

## 2014-12-10 DIAGNOSIS — D34 Benign neoplasm of thyroid gland: Secondary | ICD-10-CM

## 2015-03-31 IMAGING — CT CT ANGIO CHEST
2 of 9 series · 18 of 46 positions shown · IV contrast (APPLIED)
Comparison: Chest radiographs dated 06/07/2010. Abdomen CT at
[HOSPITAL] on 04/26/2010.

CLINICAL DATA: Shortness of breath.  Recent DVT.

EXAM:
CT ANGIOGRAPHY CHEST WITH CONTRAST
TECHNIQUE: Multidetector CT imaging of the chest was performed using the
standard protocol during bolus administration of intravenous
contrast. Multiplanar CT image reconstructions including MIPs were
obtained to evaluate the vascular anatomy.
CONTRAST:  100mL OMNIPAQUE IOHEXOL 350 MG/ML SOLN

[Series 5: thins · axial · 0.69mm/px · z∈[+1131,+1364]mm · 15 of 263 slices shown]
[im 15/263  lung]
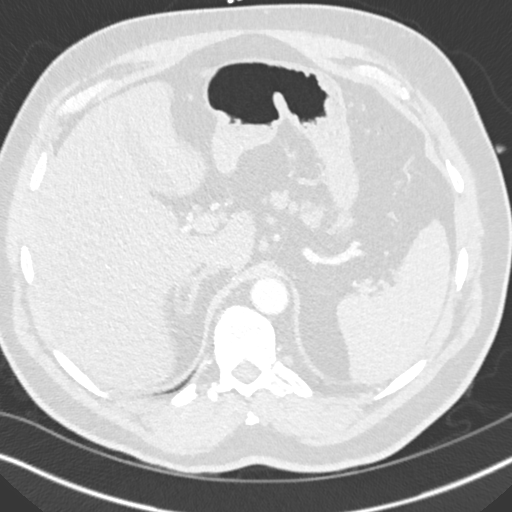
[im 30/263  soft-tissue]
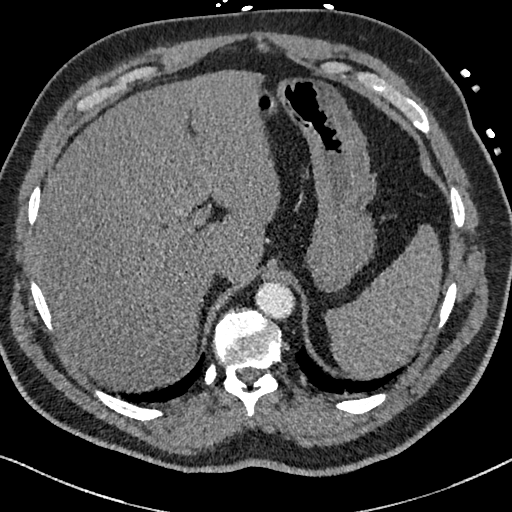
[im 44/263  lung]
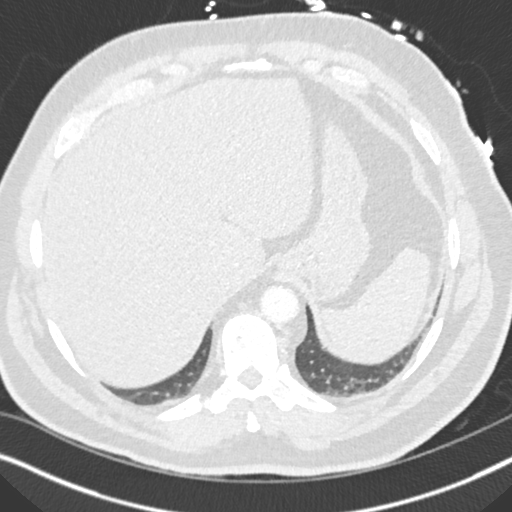
[im 59/263  soft-tissue]
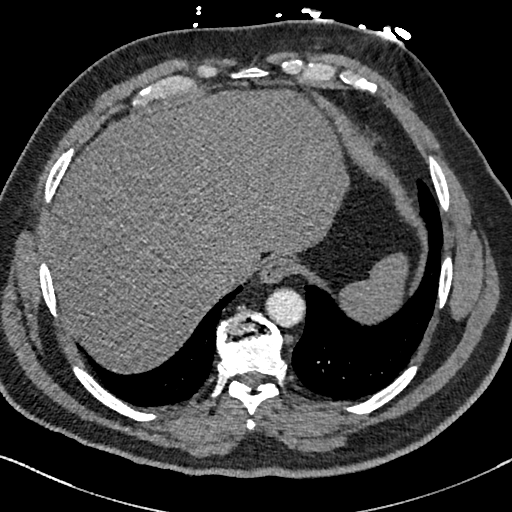
[im 88/263  lung]
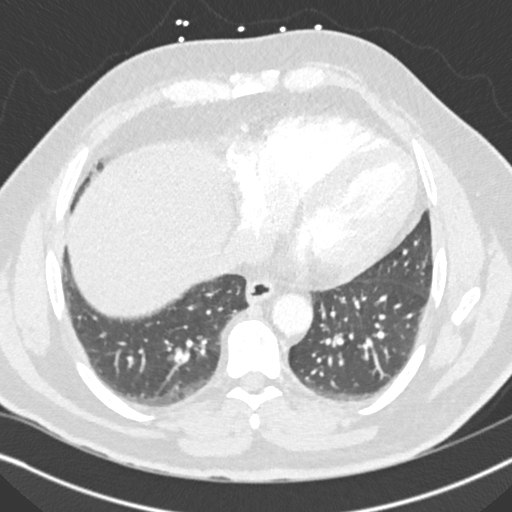
[im 102/263  soft-tissue]
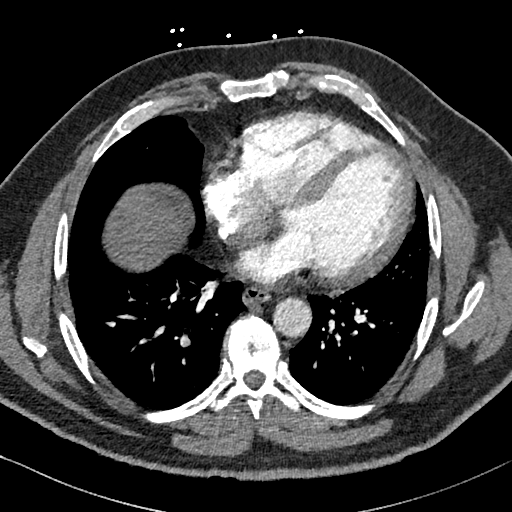
[im 117/263  lung]
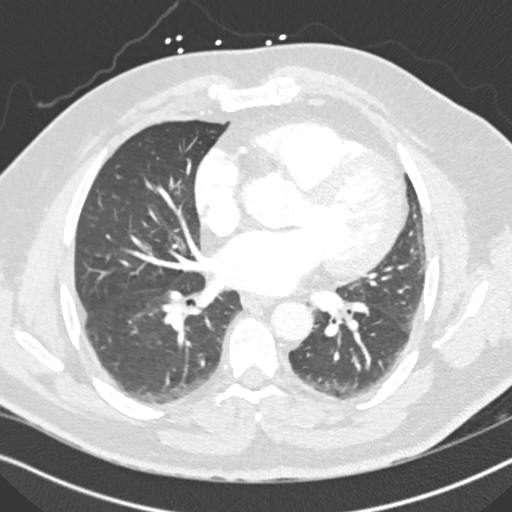
[im 132/263  soft-tissue]
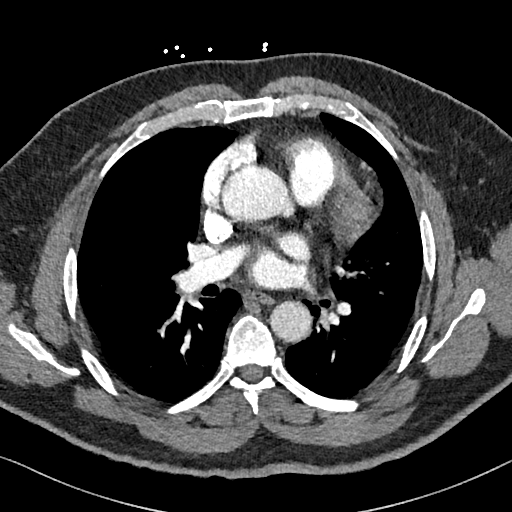
[im 146/263  lung]
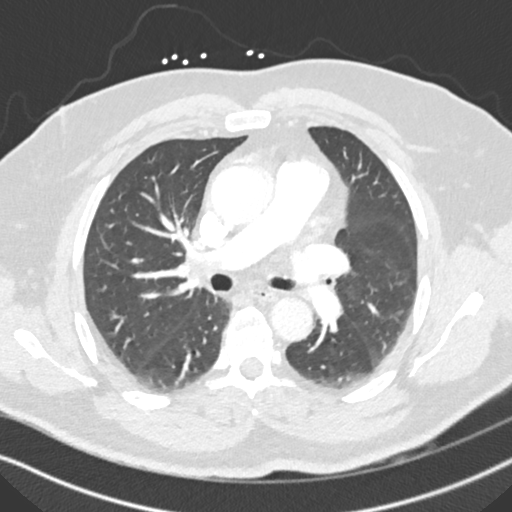
[im 161/263  soft-tissue]
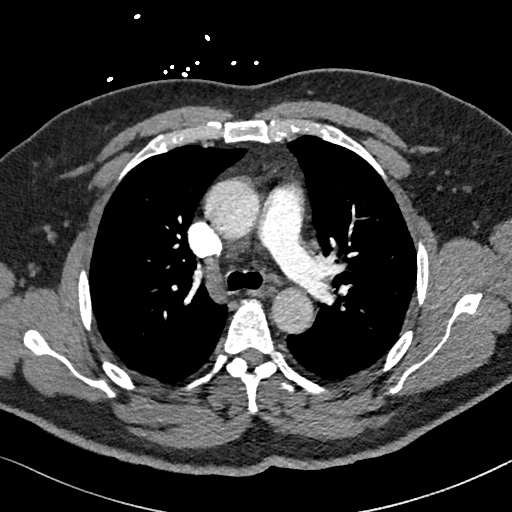
[im 175/263  lung]
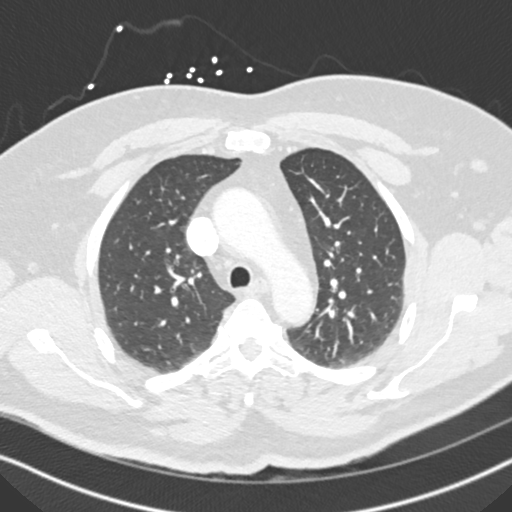
[im 204/263  soft-tissue]
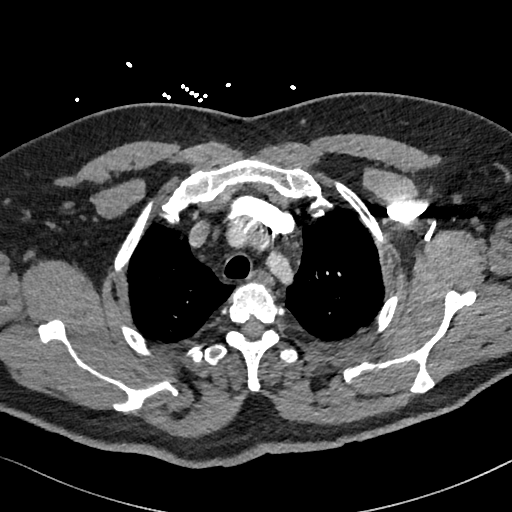
[im 219/263  lung]
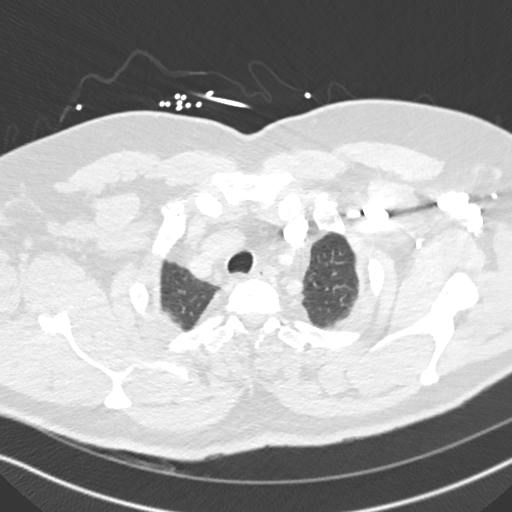
[im 233/263  soft-tissue]
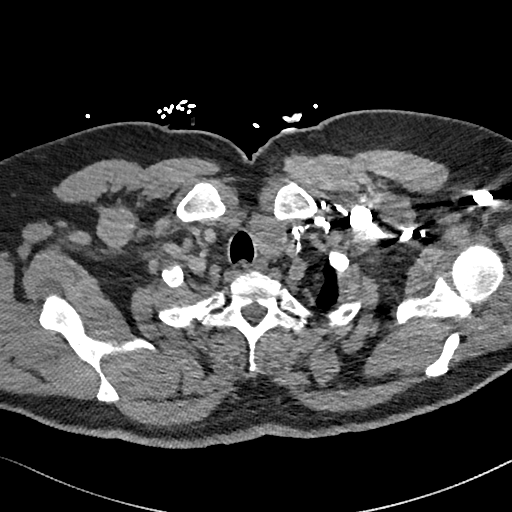
[im 248/263  lung]
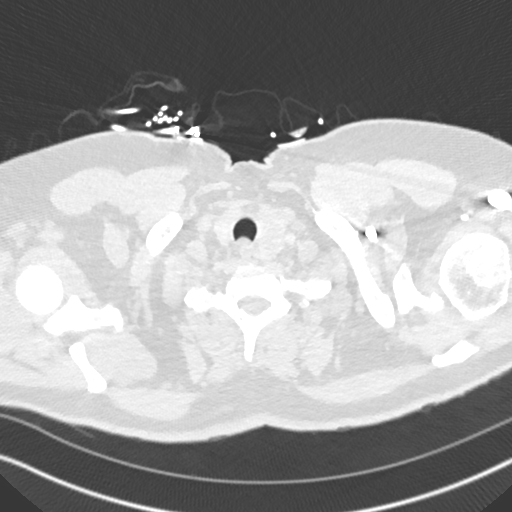

[Series 8: coronal mpr · coronal · 0.60mm/px · 3 of 144 slices shown]
[im 36/144  soft-tissue]
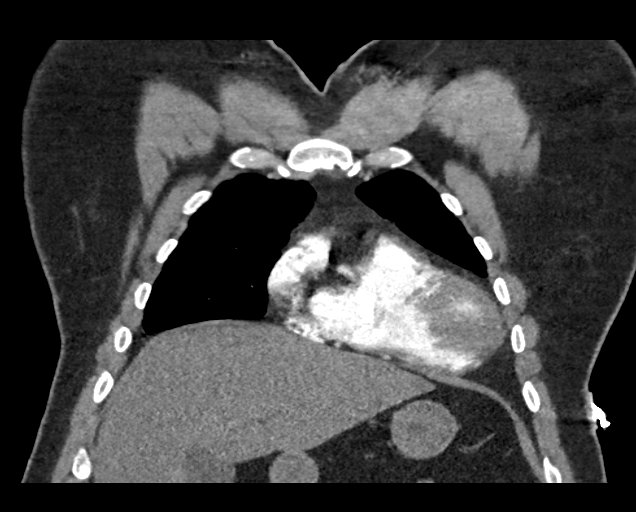
[im 72/144  soft-tissue]
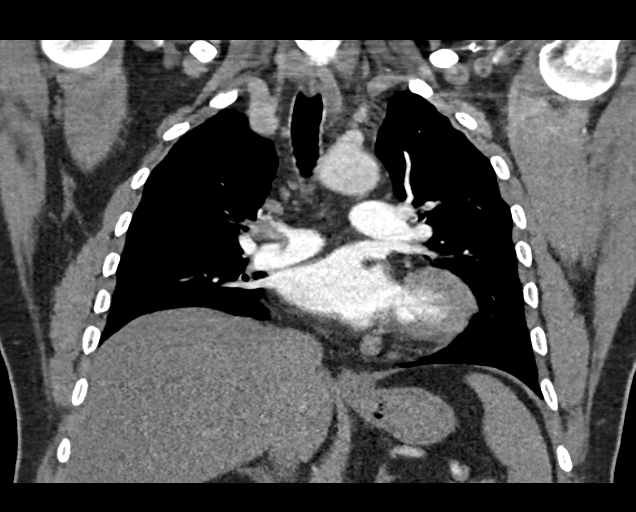
[im 108/144  soft-tissue]
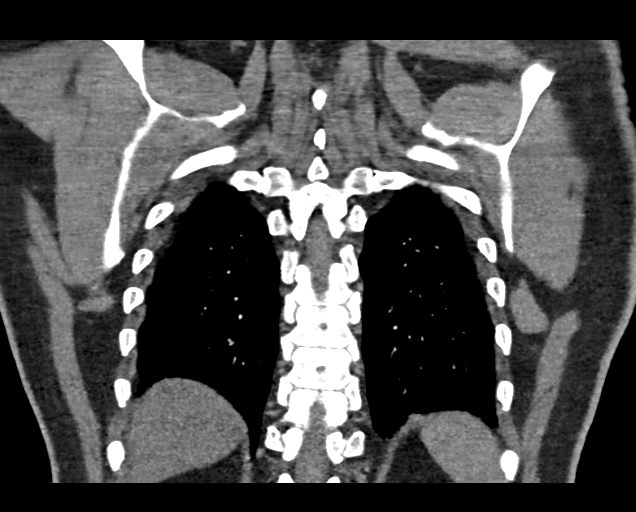

[18 of 46 positions shown; findings below may reference images not displayed]

FINDINGS: Small filling defect in the proximal intersegmental pulmonary artery
on the right. 2 small lower lobe pulmonary artery filling defects on
the right. 4 mm nodule in the right middle lobe just beneath the
minor fissure on image number 40, unchanged since 04/26/2010, most
compatible with a small subpleural lymph node. 6 mm subpleural
nodule above the minor fissure in the right upper lobe on image
number 38 and coronal image number 37, also of most compatible with
a small subpleural lymph node. No other lung nodules and no enlarged
lymph nodes. 2.0 cm left lobe thyroid nodule on image 9. Thoracic
spine degenerative changes. Mild diffuse low density of the liver
relative to the spleen.

Review of the MIP images confirms the above findings.
IMPRESSION: 1. Small pulmonary emboli on the right, as described above.
2. 2 probable subpleural lymph nodes on the right, as described
above.
3. Stable diffuse hepatic steatosis.
These results were called by telephone at the time of interpretation
on 12/23/2012 at [DATE] to Dr. JAH SON LAVONNE , who verbally
acknowledged these results.

## 2016-04-18 ENCOUNTER — Other Ambulatory Visit: Payer: Self-pay | Admitting: Family Medicine

## 2016-04-18 ENCOUNTER — Ambulatory Visit
Admission: RE | Admit: 2016-04-18 | Discharge: 2016-04-18 | Disposition: A | Discharge status: Home / Self Care | Payer: No Typology Code available for payment source | Source: Ambulatory Visit | Attending: Family Medicine | Admitting: Family Medicine

## 2016-04-18 DIAGNOSIS — R05 Cough: Secondary | ICD-10-CM

## 2016-04-18 DIAGNOSIS — R059 Cough, unspecified: Secondary | ICD-10-CM

## 2016-05-30 ENCOUNTER — Other Ambulatory Visit: Payer: Self-pay | Admitting: Otolaryngology

## 2016-05-30 DIAGNOSIS — E079 Disorder of thyroid, unspecified: Secondary | ICD-10-CM

## 2016-06-07 ENCOUNTER — Ambulatory Visit
Admission: RE | Admit: 2016-06-07 | Discharge: 2016-06-07 | Disposition: A | Discharge status: Home / Self Care | Payer: No Typology Code available for payment source | Source: Ambulatory Visit | Attending: Otolaryngology | Admitting: Otolaryngology

## 2016-06-07 DIAGNOSIS — E079 Disorder of thyroid, unspecified: Secondary | ICD-10-CM

## 2016-10-08 ENCOUNTER — Encounter (HOSPITAL_COMMUNITY): Payer: Self-pay | Admitting: *Deleted

## 2016-10-08 ENCOUNTER — Other Ambulatory Visit: Payer: Self-pay | Admitting: Urology

## 2016-10-11 NOTE — Progress Notes (Signed)
04-18-16 (EPIC) CXR

## 2016-10-11 NOTE — Patient Instructions (Addendum)
Joshua Young  10/11/2016   Your procedure is scheduled on: 10-25-16   Report to [facility] Main  Entrance Take [place]  elevators to 3rd floor to  [place] at 10:00 AM.   Call this number if you have problems the morning of surgery [phone]    Remember: ONLY 1 PERSON MAY GO WITH YOU TO SHORT STAY TO GET  READY MORNING OF [place].  Do not eat food or drink liquids :After Midnight.     Take these medicines the morning of surgery with A SIP OF WATER: Pantoprazole (Protonix)                                You may not have any metal on your body including hair pins and              piercings  Do not wear jewelry, make-up, lotions, powders or perfumes, deodorant             Men may shave face and neck.   Do not bring valuables to the hospital. [place].  Contacts, dentures or bridgework may not be worn into surgery.                Patients discharged the day of surgery will not be allowed to drive home.  Name and phone number of your driver: Joshua Young [phone]               Please read over the following fact sheets you were given: _____________________________________________________________________                            Please read over the following fact sheets you were given: _____________________________________________________________________             [facility] - Preparing for Surgery Before surgery, you can play an important role.  Because skin is not sterile, your skin needs to be as free of germs as possible.  You can reduce the number of germs on your skin by washing with CHG (chlorahexidine gluconate) soap before surgery.  CHG is an antiseptic cleaner which kills germs and bonds with the skin to continue killing germs even after washing. Please DO NOT use if you have an allergy to CHG or antibacterial soaps.  If your skin becomes reddened/irritated stop using the CHG  and inform your nurse when you arrive at Short Stay. Do not shave (including legs and underarms) for at least 48 hours prior to the first CHG shower.  You may shave your face/neck. Please follow these instructions carefully:  1.  Shower with CHG Soap the night before surgery and the  morning of Surgery.  2.  If you choose to wash your hair, wash your hair first as usual with your  normal  shampoo.  3.  After you shampoo, rinse your hair and body thoroughly to remove the  shampoo.                           4.  Use CHG as you would any other liquid soap.  You can apply chg directly  to the skin and wash  Gently with a scrungie or clean washcloth.  5.  Apply the CHG Soap to your body ONLY FROM THE NECK DOWN.   Do not use on face/ open                           Wound or open sores. Avoid contact with eyes, ears mouth and genitals (private parts).                       Wash face,  Genitals (private parts) with your normal soap.             6.  Wash thoroughly, paying special attention to the area where your surgery  will be performed.  7.  Thoroughly rinse your body with warm water from the neck down.  8.  DO NOT shower/wash with your normal soap after using and rinsing off  the CHG Soap.                9.  Pat yourself dry with a clean towel.            10.  Wear clean pajamas.            11.  Place clean sheets on your bed the night of your first shower and do not  sleep with pets. Day of Surgery : Do not apply any lotions/deodorants the morning of surgery.  Please wear clean clothes to the hospital/surgery center.  FAILURE TO FOLLOW THESE INSTRUCTIONS MAY RESULT IN THE CANCELLATION OF YOUR SURGERY PATIENT SIGNATURE_________________________________  NURSE SIGNATURE__________________________________  ________________________________________________________________________

## 2016-10-12 ENCOUNTER — Encounter (INDEPENDENT_AMBULATORY_CARE_PROVIDER_SITE_OTHER): Payer: Self-pay

## 2016-10-12 ENCOUNTER — Encounter (HOSPITAL_COMMUNITY): Payer: Self-pay

## 2016-10-12 ENCOUNTER — Encounter (HOSPITAL_COMMUNITY)
Admission: RE | Admit: 2016-10-12 | Discharge: 2016-10-12 | Disposition: A | Discharge status: Home / Self Care | Payer: No Typology Code available for payment source | Source: Ambulatory Visit | Attending: Urology | Admitting: Urology

## 2016-10-12 DIAGNOSIS — I10 Essential (primary) hypertension: Secondary | ICD-10-CM | POA: Diagnosis not present

## 2016-10-12 DIAGNOSIS — Z01810 Encounter for preprocedural cardiovascular examination: Secondary | ICD-10-CM | POA: Diagnosis present

## 2016-10-12 DIAGNOSIS — N210 Calculus in bladder: Secondary | ICD-10-CM | POA: Insufficient documentation

## 2016-10-12 DIAGNOSIS — Z01812 Encounter for preprocedural laboratory examination: Secondary | ICD-10-CM | POA: Diagnosis present

## 2016-10-12 LAB — CBC
HCT: 40 % (ref 39.0–52.0)
HEMOGLOBIN: 13.5 g/dL (ref 13.0–17.0)
MCH: 29.9 pg (ref 26.0–34.0)
MCHC: 33.8 g/dL (ref 30.0–36.0)
MCV: 88.5 fL (ref 78.0–100.0)
PLATELETS: 202 10*3/uL (ref 150–400)
RBC: 4.52 MIL/uL (ref 4.22–5.81)
RDW: 12.6 % (ref 11.5–15.5)
WBC: 7.1 10*3/uL (ref 4.0–10.5)

## 2016-10-12 LAB — BASIC METABOLIC PANEL
ANION GAP: 6 (ref 5–15)
BUN: 11 mg/dL (ref 6–20)
CALCIUM: 9 mg/dL (ref 8.9–10.3)
CO2: 27 mmol/L (ref 22–32)
CREATININE: 1.08 mg/dL (ref 0.61–1.24)
Chloride: 106 mmol/L (ref 101–111)
Glucose, Bld: 118 mg/dL — ABNORMAL HIGH (ref 65–99)
Potassium: 3.9 mmol/L (ref 3.5–5.1)
SODIUM: 139 mmol/L (ref 135–145)

## 2016-10-12 NOTE — Progress Notes (Signed)
   10/12/16 1331  OBSTRUCTIVE SLEEP APNEA  Have you ever been diagnosed with sleep apnea through a sleep study? No  Do you snore loudly (loud enough to be heard through closed doors)?  1  Do you often feel tired, fatigued, or sleepy during the daytime (such as falling asleep during driving or talking to someone)? 0  Has anyone observed you stop breathing during your sleep? 0  Do you have, or are you being treated for high blood pressure? 1  BMI more than 35 kg/m2? 1  Age > 50 (1-yes) 1  Neck circumference greater than:Male 16 inches or larger, Male 17inches or larger? 0  Male Gender (Yes=1) 1  Obstructive Sleep Apnea Score 5

## 2016-10-24 NOTE — H&P (Signed)
Office Visit Report     10/03/2016   --------------------------------------------------------------------------------   Joshua Young  MRN: [id number]  PRIMARY CARE:  Judithann Sauger, MD  DOB: 02-Dec-1950, 66 year old Male  REFERRING:  Donia Guiles, [place]  SSN: [id number]  PROVIDER:  Raynelle Bring, M.D.    LOCATION:  [facility] [phone]   --------------------------------------------------------------------------------   CC/HPI: Prostate cancer and hematuria   Mr. Joshua Young returns today 6 years out from his radical prostatectomy. He continues to maintain good continence and denies any new voiding complaints. Erectile function has been a lower priority. He mentions a new complaint of painless gross hematuria. He has had 8 or 9 episodes over the past few months where he has noted frank blood upon initiating urination. This will typically clear. He has not developed any problems with an inability to void. He denies any dysuria, flank pain, or fever. He denies a history of tobacco use. He does have a prior history of radiation therapy for adjuvant treatment of prostate cancer following radical prostatectomy.     ALLERGIES: Seafood - gout flare ups    MEDICATIONS: Centrum Silver TABS Oral  Fexofenadine HCl - 180 MG Oral Tablet 0 Oral  Fluticasone Propionate 50 MCG/ACT Nasal Suspension 0 Nasal  Losartan Potassium 50 MG Oral Tablet Oral  PriLOSEC OTC 20 MG Oral Tablet Delayed Release Oral  Trimix (PGE 20, PAP 30, PHE 0.5) 0  Vitamin C 500 MG Oral Tablet Oral  Xarelto 20 mg tablet Oral     GU PSH: Vasectomy - 2012      PSH Notes: Prostatectomy Robotic-Assisted, Surgery Of Male Genitalia Vasectomy   NON-GU PSH: None   GU PMH: History of prostate cancer - 09/28/2015 ED due to arterial insufficiency, Erectile dysfunction due to arterial insufficiency - 2017 Prostate Cancer, Prostate cancer - 2017 History of urolithiasis, Nephrolithiasis - 2014      PMH Notes:    1) Prostate cancer: He is s/p a UNS RAL radical prostatectomy on 06/22/10. He completed adjuvant radiation therapy in October 2012 under the care of Dr. Tammi Klippel. His PSA has been undetectable since surgery.   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     NON-GU PMH: Disorder of thyroid, unspecified, Thyroid mass - 2014 Pulmonary Embolism, History, History of pulmonary embolism - 2014 DVT, History GERD Hypercholesterolemia Hypertension    FAMILY HISTORY: 1 Daughter - Daughter 1 son - Son Acute Myocardial Infarction - Grandfather Chronic Lymphocytic Leukemia - Father Diabetes - Mother Hypertension - Mother nephrolithiasis - Runs In Family Renal Cell Carcinoma - Father retinitis pigmentosa - Runs In Family Transient Ischemic Attack - Grandfather   SOCIAL HISTORY: Marital Status: Married Preferred Language: English; Ethnicity: Not Hispanic Or Latino; Race: White Current Smoking Status: Patient has never smoked.  Has never drank.  Does not use drugs. Drinks 1 caffeinated drink per day.     Notes: Never A Smoker, Marital History - Currently Married, Tobacco Use, Alcohol Use, Occupation:   REVIEW OF SYSTEMS:    GU Review Male:   Patient reports frequent urination and get up at night to urinate. Patient denies hard to postpone urination, burning/ pain with urination, leakage of urine, stream starts and stops, trouble starting your streams, and have to strain to urinate .  Gastrointestinal (Lower):  Patient denies diarrhea and constipation.  Gastrointestinal (Upper):   Patient denies nausea and vomiting.  Constitutional:   Patient denies fever, night sweats, weight loss, and fatigue.  Skin:   Patient reports itching. Patient denies skin rash/ lesion.  Eyes:   Patient denies blurred vision and double vision.   Ears/ Nose/ Throat:   Patient reports sinus problems. Patient denies sore throat.  Hematologic/Lymphatic:   Patient denies swollen glands and easy bruising.  Cardiovascular:   Patient denies leg swelling and chest pains.  Respiratory:   Patient denies cough and shortness of breath.  Endocrine:   Patient denies excessive thirst.  Musculoskeletal:   Patient reports joint pain. Patient denies back pain.  Neurological:   Patient denies headaches and dizziness.  Psychologic:   Patient denies depression and anxiety.   VITAL SIGNS:      10/03/2016 01:46 PM  Weight 138 lb / 62.6 kg  Height 68 in / 172.72 cm  BP 151/75 mmHg  Pulse 75 /min  BMI 21.0 kg/m   GU PHYSICAL EXAMINATION:    Urethral Meatus: Normal size. No lesion, no wart, no discharge, no polyp. Normal location.   MULTI-SYSTEM PHYSICAL EXAMINATION:    Constitutional: Well-nourished. No physical deformities. Normally developed. Good grooming.     PAST DATA REVIEWED:  Source Of History:  Patient  Lab Test Review:   PSA  Urine Test Review:   Urinalysis   09/26/16 09/28/15 09/21/15 03/03/15 08/28/14 02/16/14 07/18/13 01/16/13  PSA  Total PSA <0.015 ng/dL < 0.02 ng/dl 0.1  <0.01  <0.01  <0.01  <0.01  <0.01     PROCEDURES:         Flexible Cystoscopy - 52000  Indication: Gross hematuria Risks, benefits, and potential complications of the procedure were discussed with the patient including infection, bleeding, voiding discomfort, urinary retention, fever, chills, sepsis, and others. All questions were answered. Informed consent was obtained. Sterile technique and intraurethral analgesia were used.  Meatus:  Normal size. Normal location. Normal condition.  Urethra:  No strictures.  External Sphincter:  Normal.  Bladder Neck:  The bladder neck, there is a calculus noted that appears to be adherent to the anterior bladder neck. This is unable to be manipulated off the bladder neck.  Ureteral Orifices:  Normal location. Normal size.  Normal shape. Effluxed clear urine.  Bladder:  No trabeculation. No tumors. Normal mucosa. No stones.      Chaperone:Deborah The procedure was well-tolerated and without complications. Instructions were given to call the office immediately if questions or problems.         Urinalysis Dipstick Dipstick Cont'd  Color: Yellow Bilirubin: Neg  Appearance: Clear Ketones: Neg  Specific Gravity: 1.025 Blood: Neg  pH: 6.0 Protein: Neg  Glucose: Neg Urobilinogen: 0.2    Nitrites: Neg    Leukocyte Esterase: Neg    ASSESSMENT:      ICD-10 Details  1 GU:   History of prostate cancer - Z85.46   2   Gross hematuria - R31.0    PLAN:           Schedule Labs: 1 Year - PSA    1 Year - Urinalysis  Return Visit/Planned Activity: 1 Year - Office Visit  Return Visit/Planned Activity: Other See Visit Notes             Note: Will call to schedule surgery          Document Letter(s):  Created for Patient: Clinical Summary  Notes:   1. Prostate cancer: His PSA remains undetectable. Follow-up in one year.   2. Hematuria: He does have a stone at his bladder neck. This appears to be related to a probable foreign body at the neck of the bladder related to his prior prostatectomy and radiation therapy. I have recommended cystoscopy and endoscopic removal of this stone and any underlying foreign body. We reviewed this procedure in detail including the potential risks, complications, and expected recovery process. He gives informed consent to proceed. This will be scheduled.   I do not think he needs to undergo an upper tract evaluation considering his finding on cystoscopy today.   Cc: Dr. Antony Contras  Dr. Tyler Pita    * Signed by Raynelle Bring, M.D. on 10/03/16 at 7:44 PM (EDT)*

## 2016-10-25 ENCOUNTER — Ambulatory Visit (HOSPITAL_COMMUNITY): Payer: No Typology Code available for payment source

## 2016-10-25 ENCOUNTER — Encounter (HOSPITAL_COMMUNITY)
Admission: RE | Disposition: A | Discharge status: Home / Self Care | Payer: Self-pay | Source: Ambulatory Visit | Attending: Urology

## 2016-10-25 ENCOUNTER — Encounter (HOSPITAL_COMMUNITY): Payer: Self-pay | Admitting: *Deleted

## 2016-10-25 ENCOUNTER — Ambulatory Visit (HOSPITAL_COMMUNITY)
Admission: RE | Admit: 2016-10-25 | Discharge: 2016-10-25 | Disposition: A | Discharge status: Home / Self Care | Payer: No Typology Code available for payment source | Source: Ambulatory Visit | Attending: Urology | Admitting: Urology

## 2016-10-25 DIAGNOSIS — I739 Peripheral vascular disease, unspecified: Secondary | ICD-10-CM | POA: Diagnosis not present

## 2016-10-25 DIAGNOSIS — I10 Essential (primary) hypertension: Secondary | ICD-10-CM | POA: Insufficient documentation

## 2016-10-25 DIAGNOSIS — Z923 Personal history of irradiation: Secondary | ICD-10-CM | POA: Insufficient documentation

## 2016-10-25 DIAGNOSIS — Z8546 Personal history of malignant neoplasm of prostate: Secondary | ICD-10-CM | POA: Insufficient documentation

## 2016-10-25 DIAGNOSIS — N210 Calculus in bladder: Secondary | ICD-10-CM | POA: Diagnosis not present

## 2016-10-25 DIAGNOSIS — Z79899 Other long term (current) drug therapy: Secondary | ICD-10-CM | POA: Diagnosis not present

## 2016-10-25 DIAGNOSIS — R310 Gross hematuria: Secondary | ICD-10-CM | POA: Diagnosis present

## 2016-10-25 DIAGNOSIS — Z86718 Personal history of other venous thrombosis and embolism: Secondary | ICD-10-CM | POA: Diagnosis not present

## 2016-10-25 DIAGNOSIS — K219 Gastro-esophageal reflux disease without esophagitis: Secondary | ICD-10-CM | POA: Insufficient documentation

## 2016-10-25 DIAGNOSIS — N5201 Erectile dysfunction due to arterial insufficiency: Secondary | ICD-10-CM | POA: Insufficient documentation

## 2016-10-25 DIAGNOSIS — Z9889 Other specified postprocedural states: Secondary | ICD-10-CM | POA: Diagnosis not present

## 2016-10-25 DIAGNOSIS — E7800 Pure hypercholesterolemia, unspecified: Secondary | ICD-10-CM | POA: Diagnosis not present

## 2016-10-25 DIAGNOSIS — Z86711 Personal history of pulmonary embolism: Secondary | ICD-10-CM | POA: Diagnosis not present

## 2016-10-25 DIAGNOSIS — Z9079 Acquired absence of other genital organ(s): Secondary | ICD-10-CM | POA: Insufficient documentation

## 2016-10-25 DIAGNOSIS — Z7901 Long term (current) use of anticoagulants: Secondary | ICD-10-CM | POA: Insufficient documentation

## 2016-10-25 HISTORY — PX: CYSTOSCOPY WITH LITHOLAPAXY: SHX1425

## 2016-10-25 SURGERY — CYSTOSCOPY, WITH BLADDER CALCULUS LITHOLAPAXY
Anesthesia: General

## 2016-10-25 MED ORDER — PHENAZOPYRIDINE HCL 100 MG PO TABS: 100.0000 mg | ORAL_TABLET | Freq: Three times a day (TID) | ORAL | 0 refills | Status: DC | PRN

## 2016-10-25 MED ORDER — ONDANSETRON HCL 4 MG/2ML IJ SOLN
INTRAMUSCULAR | Status: AC
  Filled 2016-10-25: qty 2

## 2016-10-25 MED ORDER — ONDANSETRON HCL 4 MG/2ML IJ SOLN
INTRAMUSCULAR | Status: DC | PRN
  Administered 2016-10-25: 4 mg via INTRAVENOUS

## 2016-10-25 MED ORDER — MIDAZOLAM HCL 5 MG/5ML IJ SOLN
INTRAMUSCULAR | Status: DC | PRN
  Administered 2016-10-25: 2 mg via INTRAVENOUS

## 2016-10-25 MED ORDER — LACTATED RINGERS IV SOLN
INTRAVENOUS | Status: DC
  Administered 2016-10-25: 11:00:00 via INTRAVENOUS

## 2016-10-25 MED ORDER — PROMETHAZINE HCL 25 MG/ML IJ SOLN: 6.2500 mg | INTRAMUSCULAR | Status: DC | PRN

## 2016-10-25 MED ORDER — PROPOFOL 10 MG/ML IV BOLUS
INTRAVENOUS | Status: AC
  Filled 2016-10-25: qty 20

## 2016-10-25 MED ORDER — MIDAZOLAM HCL 2 MG/2ML IJ SOLN
INTRAMUSCULAR | Status: AC
  Filled 2016-10-25: qty 2

## 2016-10-25 MED ORDER — DEXAMETHASONE SODIUM PHOSPHATE 10 MG/ML IJ SOLN
INTRAMUSCULAR | Status: DC | PRN
  Administered 2016-10-25: 10 mg via INTRAVENOUS

## 2016-10-25 MED ORDER — LIDOCAINE 2% (20 MG/ML) 5 ML SYRINGE
INTRAMUSCULAR | Status: DC | PRN
  Administered 2016-10-25: 100 mg via INTRAVENOUS

## 2016-10-25 MED ORDER — PROPOFOL 10 MG/ML IV BOLUS
INTRAVENOUS | Status: DC | PRN
  Administered 2016-10-25: 200 mg via INTRAVENOUS

## 2016-10-25 MED ORDER — FENTANYL CITRATE (PF) 100 MCG/2ML IJ SOLN
INTRAMUSCULAR | Status: DC | PRN
Start: 2016-10-25 — End: 2016-10-25
  Administered 2016-10-25 (×2): 50 ug via INTRAVENOUS

## 2016-10-25 MED ORDER — CEFAZOLIN SODIUM-DEXTROSE 2-4 GM/100ML-% IV SOLN
2.0000 g | INTRAVENOUS | Status: AC
  Administered 2016-10-25: 2 g via INTRAVENOUS
  Filled 2016-10-25: qty 100

## 2016-10-25 MED ORDER — FENTANYL CITRATE (PF) 100 MCG/2ML IJ SOLN: 25.0000 ug | INTRAMUSCULAR | Status: DC | PRN

## 2016-10-25 MED ORDER — STERILE WATER FOR IRRIGATION IR SOLN
Status: DC | PRN
Start: 2016-10-25 — End: 2016-10-25
  Administered 2016-10-25: 3000 mL

## 2016-10-25 MED ORDER — LIDOCAINE 2% (20 MG/ML) 5 ML SYRINGE
INTRAMUSCULAR | Status: AC
  Filled 2016-10-25: qty 5

## 2016-10-25 MED ORDER — DEXAMETHASONE SODIUM PHOSPHATE 10 MG/ML IJ SOLN
INTRAMUSCULAR | Status: AC
  Filled 2016-10-25: qty 1

## 2016-10-25 MED ORDER — FENTANYL CITRATE (PF) 100 MCG/2ML IJ SOLN
INTRAMUSCULAR | Status: AC
  Filled 2016-10-25: qty 2

## 2016-10-25 SURGICAL SUPPLY — 13 items
BAG URO CATCHER STRL LF (MISCELLANEOUS) ×3 IMPLANT
CLOTH BEACON ORANGE TIMEOUT ST (SAFETY) ×3 IMPLANT
COVER FOOTSWITCH UNIV (MISCELLANEOUS) IMPLANT
COVER SURGICAL LIGHT HANDLE (MISCELLANEOUS) IMPLANT
FIBER LASER FLEXIVA 1000 (UROLOGICAL SUPPLIES) IMPLANT
FIBER LASER FLEXIVA 550 (UROLOGICAL SUPPLIES) IMPLANT
GLOVE BIOGEL M STRL SZ7.5 (GLOVE) ×3 IMPLANT
GOWN STRL REUS W/TWL LRG LVL3 (GOWN DISPOSABLE) ×6 IMPLANT
MANIFOLD NEPTUNE II (INSTRUMENTS) ×3 IMPLANT
PACK CYSTO (CUSTOM PROCEDURE TRAY) ×3 IMPLANT
SYRINGE IRR TOOMEY STRL 70CC (SYRINGE) IMPLANT
TUBING CONNECTING 10 (TUBING) ×2 IMPLANT
TUBING CONNECTING 10' (TUBING) ×1

## 2016-10-25 NOTE — Discharge Instructions (Signed)
1. You may see some blood in the urine and may have some burning with urination for 48-72 hours. You also may notice that you have to urinate more frequently or urgently after your procedure which is normal.  °2. You should call should you develop an inability urinate, fever > 101, persistent nausea and vomiting that prevents you from eating or drinking to stay hydrated.  °

## 2016-10-25 NOTE — Op Note (Signed)
  Preoperative diagnosis: Microscopic hematuria, bladder neck calculus  Postoperative diagnosis: Microscopic hematuria, bladder neck calculus, bladder neck foreign body  Procedures: 1.  Cystoscopy 2.  Removal bladder neck calculus and urethral foreign body 3.  Fulguration of bladder neck  Surgeon: Pryor Curia. M.D.  Anesthesia: General  Complications: None  EBL: Minimal  Indication: Joshua Young is a 66 year old gentleman with a distant history of prostate cancer status post radical prostatectomy.  He recently presented for routine surveillance and was noted on his urinalysis to have microscopic hematuria.  He underwent office cystoscopy that revealed a bladder neck calculus.  There was concern that he may have an underlying foreign body as a nidus for his calculus.  He presents today for definitive treatment.  We have reviewed the potential risks, complications, and expected recovery process associated with the above procedures.  He gives informed consent to proceed.  Description of procedure: The patient was taken to the operating room and a general anesthetic was administered.  He was given preoperative antibiotics, placed in the dorsal lithotomy position, and prepped and draped in the usual sterile fashion.  Next, a preoperative timeout was performed.  Cystourethroscopy was performed and revealed a normal anterior urethra.  The prostate was surgically absent.  At the neck of the bladder, a small calculus was identified as previously noted.  Systematic examination of the bladder was then performed and revealed radiation changes throughout the bladder particularly near the bladder neck but no definite bladder tumors, stones, or other significant mucosal pathology.  Attention then returned to the bladder neck.  There was somewhat difficult to visualize the stone with the 30 lens.  I therefore switched to a 70 lens.  The stone was able to be removed with a flexible grasper.  There then  appeared to be 2 separate staples that had eroded through the urethral mucosa and/or serving as a nidus for stone formation.  These were able to be removed with the flexible graspers.  Further inspection of the bladder neck did reveal these areas to be bleeding.  I therefore used the Bugbee electrode cautery for hemostasis.  This resulted in good hemostatic control.  The bladder was then emptied and the procedure ended.  He tolerated the procedure well without complications.  He was able to be awakened and transferred to the recovery unit in satisfactory condition.

## 2016-10-25 NOTE — Interval H&P Note (Signed)
History and Physical Interval Note:  10/25/2016 11:31 AM  Joshua Young  has presented today for surgery, with the diagnosis of BLADDER CALCULUS  The various methods of treatment have been discussed with the patient and family. After consideration of risks, benefits and other options for treatment, the patient has consented to  Procedure(s) with comments: CYSTOSCOPY WITH LITHOLAPAXY (N/A) - NEED 45 MIN TOTAL as a surgical intervention .  The patient's history has been reviewed, patient examined, no change in status, stable for surgery.  I have reviewed the patient's chart and labs.  Questions were answered to the patient's satisfaction.     Tanairi Cypert,LES

## 2016-10-25 NOTE — Anesthesia Procedure Notes (Signed)
Procedure Name: LMA Insertion Date/Time: 10/25/2016 12:28 PM Performed by: Maxwell Caul Pre-anesthesia Checklist: Patient identified, Emergency Drugs available, Suction available and Patient being monitored Patient Re-evaluated:Patient Re-evaluated prior to induction Oxygen Delivery Method: Circle system utilized Preoxygenation: Pre-oxygenation with 100% oxygen Induction Type: IV induction LMA: LMA inserted LMA Size: 5.0 Number of attempts: 1 Placement Confirmation: positive ETCO2 and breath sounds checked- equal and bilateral Tube secured with: Tape Dental Injury: Teeth and Oropharynx as per pre-operative assessment

## 2016-10-25 NOTE — Anesthesia Preprocedure Evaluation (Signed)
Anesthesia Evaluation  Patient identified by MRN, date of birth, ID band Patient awake    Reviewed: Allergy & Precautions, NPO status , Patient's Chart, lab work & pertinent test results  Airway Mallampati: II  TM Distance: >3 FB Neck ROM: Full    Dental  (+) Dental Advisory Given   Pulmonary neg pulmonary ROS,    breath sounds clear to auscultation       Cardiovascular hypertension, Pt. on medications + Peripheral Vascular Disease   Rhythm:Regular Rate:Normal     Neuro/Psych negative neurological ROS     GI/Hepatic Neg liver ROS, GERD  ,  Endo/Other  negative endocrine ROS  Renal/GU negative Renal ROS     Musculoskeletal  (+) Arthritis ,   Abdominal   Peds  Hematology negative hematology ROS (+)   Anesthesia Other Findings   Reproductive/Obstetrics                             Lab Results  Component Value Date   WBC 7.1 10/12/2016   HGB 13.5 10/12/2016   HCT 40.0 10/12/2016   MCV 88.5 10/12/2016   PLT 202 10/12/2016   Lab Results  Component Value Date   CREATININE 1.08 10/12/2016   BUN 11 10/12/2016   NA 139 10/12/2016   K 3.9 10/12/2016   CL 106 10/12/2016   CO2 27 10/12/2016    Anesthesia Physical Anesthesia Plan  ASA: II  Anesthesia Plan: General   Post-op Pain Management:    Induction: Intravenous  PONV Risk Score and Plan: 3 and Ondansetron, Dexamethasone, Midazolam and Treatment may vary due to age or medical condition  Airway Management Planned: LMA  Additional Equipment:   Intra-op Plan:   Post-operative Plan: Extubation in OR  Informed Consent: I have reviewed the patients History and Physical, chart, labs and discussed the procedure including the risks, benefits and alternatives for the proposed anesthesia with the patient or authorized representative who has indicated his/her understanding and acceptance.   Dental advisory given  Plan Discussed  with: CRNA  Anesthesia Plan Comments:         Anesthesia Quick Evaluation

## 2016-10-25 NOTE — Transfer of Care (Signed)
Immediate Anesthesia Transfer of Care Note  Patient: Joshua Young  Procedure(s) Performed: Procedure(s) with comments: CYSTOSCOPY WITH LITHOLAPAXY (N/A) - NEED 45 MIN TOTAL  Patient Location: PACU  Anesthesia Type:General  Level of Consciousness:  sedated, patient cooperative and responds to stimulation  Airway & Oxygen Therapy:Patient Spontanous Breathing and Patient connected to face mask oxgen  Post-op Assessment:  Report given to PACU RN and Post -op Vital signs reviewed and stable  Post vital signs:  Reviewed and stable  Last Vitals:  Vitals:   10/25/16 0946  BP: (!) 154/83  Pulse: 74  Resp: 18  Temp: 37.2 C  SpO2: 78%    Complications: No apparent anesthesia complications

## 2016-10-25 NOTE — Anesthesia Postprocedure Evaluation (Signed)
Anesthesia Post Note  Patient: Joshua Young  Procedure(s) Performed: Procedure(s) (LRB): CYSTOSCOPY WITH LITHOLAPAXY (N/A)     Patient location during evaluation: PACU Anesthesia Type: General Level of consciousness: awake and alert Pain management: pain level controlled Vital Signs Assessment: post-procedure vital signs reviewed and stable Respiratory status: spontaneous breathing, nonlabored ventilation, respiratory function stable and patient connected to nasal cannula oxygen Cardiovascular status: blood pressure returned to baseline and stable Postop Assessment: no apparent nausea or vomiting Anesthetic complications: no    Last Vitals:  Vitals:   10/25/16 1330 10/25/16 1338  BP: 132/79 127/81  Pulse: 68 69  Resp: 15 16  Temp:  36.7 C  SpO2: 98% 94%    Last Pain:  Vitals:   10/25/16 1338  TempSrc:   PainSc: 0-No pain                 Tiajuana Amass

## 2016-12-07 ENCOUNTER — Ambulatory Visit
Admission: RE | Admit: 2016-12-07 | Discharge: 2016-12-07 | Disposition: A | Discharge status: Home / Self Care | Payer: No Typology Code available for payment source | Source: Ambulatory Visit | Attending: Family Medicine | Admitting: Family Medicine

## 2016-12-07 ENCOUNTER — Other Ambulatory Visit: Payer: Self-pay | Admitting: Family Medicine

## 2016-12-07 DIAGNOSIS — M542 Cervicalgia: Secondary | ICD-10-CM

## 2017-02-19 ENCOUNTER — Encounter: Payer: Self-pay | Admitting: Neurology

## 2017-02-19 ENCOUNTER — Ambulatory Visit (INDEPENDENT_AMBULATORY_CARE_PROVIDER_SITE_OTHER): Payer: No Typology Code available for payment source | Admitting: Neurology

## 2017-02-19 ENCOUNTER — Encounter (INDEPENDENT_AMBULATORY_CARE_PROVIDER_SITE_OTHER): Payer: Self-pay

## 2017-02-19 VITALS — BP 143/87 | HR 67 | Ht 68.0 in | Wt 244.0 lb

## 2017-02-19 DIAGNOSIS — G250 Essential tremor: Secondary | ICD-10-CM

## 2017-02-19 NOTE — Progress Notes (Signed)
Subjective:    Patient ID: Joshua Young is a 67 y.o. male.  HPI     Star Age, MD, PhD [facility] [address], [address] [place], [place] [address]  Dear Dr. Moreen Fowler,   I saw your patient, Joshua Young, upon your kind request in my neurologic clinic today for initial consultation of his tremors. The patient is unaccompanied today. As you know, Joshua Young is a 66 year old right-handed gentleman with an underlying medical history of kidney stones, reflux disease, gout, history of DVT , history of PE, vitamin D deficiency, right-sided sciatica, prediabetes, prostate cancer with status post prostatectomy and radiation therapy, hypertension, joint pain, seasonal allergies, arthritis and obesity, who reports a long-standing history of bilateral upper extremity tremors. He has family history of tremors and his mother. He does not typically drink alcohol on a regular basis. He has not been on any symptomatic treatment for tremor control. I reviewed your office note from 12/07/2016, which you kindly included. He had blood work on 12/07/2016 which I reviewed: Blood sugar level was 144, otherwise CMP was unremarkable, A1c was 6.0, TSH normal, vitamin B12 was 408. He lives with his wife and his son, who is separated. There have been some stressors recently.  He works for the [employer], building services and an Agricultural consultant. He has 2 grown children, one son and one daughter. He is a nonsmoker and does not utilize alcohol on a regular basis, drinks caffeine in the form of soda, 1-2 per day. He admits, that he does not always hydrate well enough with water.   His Past Medical History Is Significant For: Past Medical History:  Diagnosis Date  . Arthritis   . DVT, lower extremity ([place])   . Family history of anesthesia complication    dementia after anesthesia with an uncle  . GERD (gastroesophageal reflux disease)    takes Omeprazole daily  . Gout    tried taking  Allopurinol but decided not to take it  . Headache(784.0)    occasionally  . History of blood clots    right calf(2008) and last yr in left leg put on Xarelto and went into right lung  . History of colon polyps   . History of kidney stones early 31's  . Hypertension    takes Losartan daily  . Joint pain   . PONV (postoperative nausea and vomiting)   . Prostate cancer ([place]) 04/07/10   gleason 8, volume 30.069cc  . S/P radiation therapy 4-12 wks ago 9/12-10-12   prostate  . Seasonal allergies    takes Allegra daily as needed as well as using Nasonex as needed    His Past Surgical History Is Significant For: Past Surgical History:  Procedure Laterality Date  . COLONOSCOPY    . CYSTOSCOPY WITH LITHOLAPAXY N/A 10/25/2016   Procedure: CYSTOSCOPY WITH LITHOLAPAXY;  Surgeon: Raynelle Bring, MD;  Location: [facility];  Service: Urology;  Laterality: N/A;  NEED 45 MIN TOTAL  . prostatecomy     complete  . VASECTOMY      His Family History Is Significant For: Family History  Problem Relation Age of Onset  . Cancer Father        CLL    His Social History Is Significant For: Social History   Socioeconomic History  . Marital status: Married    Spouse name: None  . Number of children: None  . Years of education: None  . Highest education level: None  Social Needs  . Financial  resource strain: None  . Food insecurity - worry: None  . Food insecurity - inability: None  . Transportation needs - medical: None  . Transportation needs - non-medical: None  Occupational History  . None  Tobacco Use  . Smoking status: Never Smoker  . Smokeless tobacco: Never Used  Substance and Sexual Activity  . Alcohol use: No  . Drug use: No  . Sexual activity: Yes  Other Topics Concern  . None  Social History Narrative  . None    His Allergies Are:  Allergies  Allergen Reactions  . Other Swelling    Allopurinol - CAUSED SWELLING ( UNSURE OF NAME)   . Shellfish Allergy     Swelling  :    His Current Medications Are:  Outpatient Encounter Medications as of 02/19/2017  Medication Sig  . Cholecalciferol (VITAMIN D) 2000 units CAPS Take 2,000 Units by mouth daily.  . fexofenadine (ALLEGRA) 180 MG tablet Take 180 mg by mouth daily as needed for allergies.   Marland Kitchen losartan (COZAAR) 100 MG tablet Take 100 mg by mouth daily.  . Misc Natural Products (TART CHERRY ADVANCED) CAPS Take 1 capsule by mouth daily.  . mometasone (NASONEX) 50 MCG/ACT nasal spray Place 2 sprays into the nose daily as needed (for congestion).   . Multiple Vitamins-Minerals (CENTRUM SILVER PO) Take 1 tablet by mouth daily.   Marland Kitchen OVER THE COUNTER MEDICATION Take 1 tablet by mouth daily. Uric acid flush  . OVER THE COUNTER MEDICATION Take 1 tablet by mouth daily.  . pantoprazole (PROTONIX) 40 MG tablet Take 40 mg by mouth 2 (two) times daily.  . phenazopyridine (PYRIDIUM) 100 MG tablet Take 1 tablet (100 mg total) by mouth 3 (three) times daily as needed for pain (for burning).  . rivaroxaban (XARELTO) 20 MG TABS tablet Take 20 mg by mouth daily.   . vitamin C (ASCORBIC ACID) 500 MG tablet Take 500 mg by mouth daily.    Facility-Administered Encounter Medications as of 02/19/2017  Medication  . lactated ringers infusion  :   Review of Systems:  Out of a complete 14 point review of systems, all are reviewed and negative with the exception of these symptoms as listed below:  Review of Systems  Neurological:       Pt presents today to discuss his bilateral hand tremor. Pt says that he has "shook" all his life, but as he has gotten older, the tremors have worsened.    Objective:  Neurological Exam  Physical Exam Physical Examination:   Vitals:   02/19/17 1435  BP: (!) 143/87  Pulse: 67    General Examination: The patient is a very pleasant 67 y.o. male in no acute distress. He appears well-developed and well-nourished and well groomed.   HEENT: Normocephalic, atraumatic, pupils are equal, round and  reactive to light and accommodation. Extraocular tracking is good without limitation to gaze excursion or nystagmus noted. Normal smooth pursuit is noted. Hearing is grossly intact. Tympanic membranes are clear bilaterally. Face is symmetric with normal facial animation and normal facial sensation. Speech is clear with no dysarthria noted. There is no hypophonia. There is no lip, neck/head, jaw or voice tremor. Neck is supple with full range of passive and active motion. There are no carotid bruits on auscultation. Oropharynx exam reveals: moderate mouth dryness. Tongue protrudes centrally and palate elevates symmetrically.    Chest: Clear to auscultation without wheezing, rhonchi or crackles noted.  Heart: S1+S2+0, regular and normal without murmurs, rubs or gallops  noted.   Abdomen: Soft, non-tender and non-distended with normal bowel sounds appreciated on auscultation.  Extremities: There is no pitting edema in the distal lower extremities bilaterally. Pedal pulses are intact.  Skin: Warm and dry without trophic changes noted.  Musculoskeletal: exam reveals no obvious joint deformities, tenderness or joint swelling or erythema, mild scoliosis.   Neurologically:  Mental status: The patient is awake, alert and oriented in all 4 spheres. His immediate and remote memory, attention, language skills and fund of knowledge are appropriate. There is no evidence of aphasia, agnosia, apraxia or anomia. Speech is clear with normal prosody and enunciation. Thought process is linear. Mood is normal and affect is normal.  Cranial nerves II - XII are as described above under HEENT exam. In addition: shoulder shrug is normal with equal shoulder height noted. Motor exam: Normal bulk, strength and tone is noted. There is no drift, resting tremor or rebound. Slight to mild b/l UE postural tremor, minimal action tremor.  On 02/19/2017: On Archimedes spiral drawing he has minimal trembling with the left and very slight  trembling noted with the right, handwriting is minimally tremulous, legible, not micrographic. Romberg is negative. Reflexes are 1+ throughout. Fine motor skills and coordination: intact with normal finger taps, normal hand movements, normal rapid alternating patting, normal foot taps and normal foot agility.  Cerebellar testing: No dysmetria or intention tremor on finger to nose testing. Heel to shin is unremarkable bilaterally. There is no truncal or gait ataxia.  Sensory exam: intact to light touch in the upper and lower extremities.  Gait, station and balance: He stands easily. No veering to one side is noted. No leaning to one side is noted. Posture is age-appropriate and stance is narrow based. Gait shows normal stride length and normal pace. No problems turning are noted. Tandem walk is unremarkable. Intact toe and heel stance is noted.               Assessment and Plan:   In summary, Joshua Young is a very pleasant 67 y.o.-year old male  with an underlying medical history of kidney stones, reflux disease, gout, history of DVT, history of PE, vitamin D deficiency, right-sided sciatica, prediabetes, prostate cancer with status post prostatectomy and radiation therapy, hypertension, joint pain, seasonal allergies, arthritis and obesity, who presents for neurologic consultation for a longstanding history of tremor affecting both UEs. On examination he has a mild postural and action tremor, no evidence of parkinsonism. He is reassured in that regard. 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 His symptoms and findings are mild enough to assume a watch and wait approach. I did not recommend any symptomatic treatment. He does not desire treatment at this time. We talked about potential tremor triggers as well. His neurological exam is otherwise nonfocal. He is advised to follow-up as needed. I answered all his questions today and he was in agreement.  Thank you very much for allowing me to  participate in the care of this nice patient. If I can be of any further assistance to you please do not hesitate to call me at [phone].  Sincerely,   Star Age, MD, PhD

## 2017-02-19 NOTE — Patient Instructions (Addendum)
Your tremor is mild at this point, no signs of parkinsonism. I would not recommend any medication at this time.   Please remember, that any kind of tremor may be exacerbated by anxiety, anger, nervousness, excitement, dehydration, sleep deprivation, by caffeine, and low blood sugar values or blood sugar fluctuations. Some medications, especially some antidepressants and lithium can cause or exacerbate tremors. Tremors may temporarily calm down or subside with the use of a benzodiazepine such as Valium or related medications and with alcohol. Be aware, however, that drinking alcohol is not an approved or appropriate treatment for tremor control and long-term use of benzodiazepines such as Valium, lorazepam, alprazolam, or clonazepam can cause habit formation, physical and psychological addiction. There are very few medications that symptomatically help with tremor reduction, none are without potential side effects.   I think we can play it by ear.

## 2017-12-31 ENCOUNTER — Other Ambulatory Visit: Payer: Self-pay | Admitting: Otolaryngology

## 2017-12-31 DIAGNOSIS — E079 Disorder of thyroid, unspecified: Secondary | ICD-10-CM

## 2018-01-13 ENCOUNTER — Ambulatory Visit
Admission: RE | Admit: 2018-01-13 | Discharge: 2018-01-13 | Disposition: A | Discharge status: Home / Self Care | Payer: No Typology Code available for payment source | Source: Ambulatory Visit | Attending: Otolaryngology | Admitting: Otolaryngology

## 2018-01-13 DIAGNOSIS — E079 Disorder of thyroid, unspecified: Secondary | ICD-10-CM

## 2019-03-08 ENCOUNTER — Ambulatory Visit: Payer: No Typology Code available for payment source | Attending: Internal Medicine

## 2019-03-08 DIAGNOSIS — Z23 Encounter for immunization: Secondary | ICD-10-CM

## 2019-03-08 NOTE — Progress Notes (Signed)
   Covid-19 Vaccination Clinic  Name:  Joshua Young    MRN: [id number] DOB: [birth year]  03/08/2019  [name] was observed post Covid-19 immunization for 30 minutes based on pre-vaccination screening without incidence. He was provided with Vaccine Information Sheet and instruction to access the V-Safe system.   [name] was instructed to call 911 with any severe reactions post vaccine: Marland Kitchen Difficulty breathing  . Swelling of your face and throat  . A fast heartbeat  . A bad rash all over your body  . Dizziness and weakness    Immunizations Administered    Name Date Dose VIS Date Route   Pfizer COVID-19 Vaccine 03/08/2019 12:16 PM 0.3 mL 01/23/2019 Intramuscular   Manufacturer: [place]   Lot: [id number]   [place]: [id number]

## 2019-03-25 ENCOUNTER — Ambulatory Visit: Payer: No Typology Code available for payment source

## 2019-03-30 ENCOUNTER — Ambulatory Visit: Payer: No Typology Code available for payment source | Attending: Internal Medicine

## 2019-03-30 DIAGNOSIS — Z23 Encounter for immunization: Secondary | ICD-10-CM

## 2019-03-30 NOTE — Progress Notes (Signed)
   Covid-19 Vaccination Clinic  Name:  Joshua Young    MRN: [id number] DOB: 19-Jan-1951  03/30/2019  [name] was observed post Covid-19 immunization for 15 minutes without incidence. He was provided with Vaccine Information Sheet and instruction to access the V-Safe system.   [name] was instructed to call 911 with any severe reactions post vaccine: Marland Kitchen Difficulty breathing  . Swelling of your face and throat  . A fast heartbeat  . A bad rash all over your body  . Dizziness and weakness    Immunizations Administered    Name Date Dose VIS Date Route   Pfizer COVID-19 Vaccine 03/30/2019  3:02 PM 0.3 mL 01/23/2019 Intramuscular   Manufacturer: [place]   Lot: [id number]   [place]: [id number]

## 2019-11-28 ENCOUNTER — Ambulatory Visit: Payer: No Typology Code available for payment source | Attending: Internal Medicine

## 2019-11-28 DIAGNOSIS — Z23 Encounter for immunization: Secondary | ICD-10-CM

## 2019-11-28 NOTE — Progress Notes (Signed)
   Covid-19 Vaccination Clinic  Name:  Joshua Young    MRN: [id number] DOB: 1950-09-03  11/28/2019  [name] was observed post Covid-19 immunization for 15 minutes without incident. He was provided with Vaccine Information Sheet and instruction to access the V-Safe system.   [name] was instructed to call 911 with any severe reactions post vaccine: Marland Kitchen Difficulty breathing  . Swelling of face and throat  . A fast heartbeat  . A bad rash all over body  . Dizziness and weakness

## 2020-06-22 ENCOUNTER — Encounter: Payer: Self-pay | Admitting: Interventional Cardiology

## 2020-06-22 ENCOUNTER — Other Ambulatory Visit: Payer: Self-pay

## 2020-06-22 ENCOUNTER — Ambulatory Visit: Payer: PPO | Admitting: Interventional Cardiology

## 2020-06-22 ENCOUNTER — Encounter: Payer: Self-pay | Admitting: *Deleted

## 2020-06-22 VITALS — BP 124/72 | HR 64 | Ht 68.0 in | Wt 236.8 lb

## 2020-06-22 DIAGNOSIS — R9431 Abnormal electrocardiogram [ECG] [EKG]: Secondary | ICD-10-CM | POA: Diagnosis not present

## 2020-06-22 DIAGNOSIS — I4510 Unspecified right bundle-branch block: Secondary | ICD-10-CM

## 2020-06-22 DIAGNOSIS — E669 Obesity, unspecified: Secondary | ICD-10-CM

## 2020-06-22 DIAGNOSIS — R0602 Shortness of breath: Secondary | ICD-10-CM | POA: Diagnosis not present

## 2020-06-22 NOTE — Progress Notes (Signed)
Cardiology Office Note   Date:  06/22/2020   ID:  Joshua Young, DOB Jul 02, 1950, MRN [id number]  PCP:  Carol Ada, MD    No chief complaint on file.  Shortness of breath  Wt Readings from Last 3 Encounters:  06/22/20 236 lb 12.8 oz (107.4 kg)  02/19/17 244 lb (110.7 kg)  10/25/16 245 lb (111.1 kg)       History of Present Illness: Joshua Young is a 70 y.o. male who is being seen today for the evaluation of shortness of breath at the request of Antony Contras, MD.  He states that people around him noticed that he is breathing hard.  He walks a lot of stairs at work and this is the usual time he hears these comments.  He feels some SHOB when he lies down at night.  He sleeps on an incline due to acid reflux.    No chest pain.  DOE after 1 flight of stairs.    REtired from Investment banker, [employer].   No prior cardiac w/u.   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  Normal LVEF at that time.   Walks a lot.  Works as a Neurosurgeon as well.    Denies :  Dizziness. Leg edema. Nitroglycerin use. Orthopnea. Palpitations. Paroxysmal nocturnal dyspnea.  Syncope.    Past Medical History:  Diagnosis Date  . Arthritis   . DVT, lower extremity ([place])   . Family history of anesthesia complication    dementia after anesthesia with an uncle  . GERD (gastroesophageal reflux disease)    takes Omeprazole daily  . Gout    tried taking Allopurinol but decided not to take it  . Headache(784.0)    occasionally  . History of blood clots    right calf(2008) and last yr in left leg put on Xarelto and went into right lung  . History of colon polyps   . History of kidney stones early 102's  . Hypertension    takes Losartan daily  . Joint pain   . PONV (postoperative nausea and vomiting)   . Prostate cancer ([place]) 04/07/10   gleason 8, volume 30.069cc  . S/P radiation therapy 4-12 wks ago 9/12-10-12   prostate  . Seasonal allergies     takes Allegra daily as needed as well as using Nasonex as needed    Past Surgical History:  Procedure Laterality Date  . COLONOSCOPY    . CYSTOSCOPY WITH LITHOLAPAXY N/A 10/25/2016   Procedure: CYSTOSCOPY WITH LITHOLAPAXY;  Surgeon: Raynelle Bring, MD;  Location: [facility];  Service: Urology;  Laterality: N/A;  NEED 45 MIN TOTAL  . prostatecomy     complete  . VASECTOMY       Current Outpatient Medications  Medication Sig Dispense Refill  . Cholecalciferol (VITAMIN D) 2000 units CAPS Take 2,000 Units by mouth daily.    . fexofenadine (ALLEGRA) 180 MG tablet Take 180 mg by mouth daily as needed for allergies.    Marland Kitchen losartan (COZAAR) 100 MG tablet Take 100 mg by mouth daily.    . Misc Natural Products (TART CHERRY ADVANCED) CAPS Take 1 capsule by mouth daily.    . mometasone (NASONEX) 50 MCG/ACT nasal spray Place 2 sprays into the nose daily as needed (for congestion).     . Multiple Vitamins-Minerals (CENTRUM SILVER PO) Take 1 tablet by mouth daily.    Marland Kitchen OVER THE COUNTER MEDICATION Take 1 tablet by  mouth daily. Uric acid flush    . OVER THE COUNTER MEDICATION Take 1 tablet by mouth daily.    . pantoprazole (PROTONIX) 40 MG tablet Take 40 mg by mouth 2 (two) times daily.    . phenazopyridine (PYRIDIUM) 100 MG tablet Take 1 tablet (100 mg total) by mouth 3 (three) times daily as needed for pain (for burning). 20 tablet 0  . rivaroxaban (XARELTO) 20 MG TABS tablet Take 20 mg by mouth daily.     . vitamin C (ASCORBIC ACID) 500 MG tablet Take 500 mg by mouth daily.     No current facility-administered medications for this visit.   Facility-Administered Medications Ordered in Other Visits  Medication Dose Route Frequency Provider Last Rate Last Admin  . lactated ringers infusion    Continuous PRN Rogers Blocker, CRNA   New Bag at 09/24/13 0700    Allergies:   Allopurinol, Other, and Shellfish allergy    Social History:  The patient  reports that he has never smoked. He has never  used smokeless tobacco. He reports that he does not drink alcohol and does not use drugs.   Family History:  The patient's family history includes Cancer in his father. No early CAD in siblings   ROS:  Please see the history of present illness.   Otherwise, review of systems are positive for DOE.   All other systems are reviewed and negative.    PHYSICAL EXAM: VS:  BP 124/72   Pulse 64   Ht 5\' 8"  (1.727 m)   Wt 236 lb 12.8 oz (107.4 kg)   SpO2 95%   BMI 36.01 kg/m  , BMI Body mass index is 36.01 kg/m. GEN: Well nourished, well developed, in no acute distress  HEENT: normal  Neck: no JVD, carotid bruits, or masses Cardiac: RRR; no murmurs, rubs, or gallops,no edema  Respiratory:  clear to auscultation bilaterally, normal work of breathing GI: soft, nontender, nondistended, + BS MS: no deformity or atrophy  Skin: warm and dry, no rash Neuro:  Strength and sensation are intact Psych: euthymic mood, full affect   EKG:   The ekg ordered today demonstrates NSR, RBBB   Recent Labs: No results found for requested labs within last 8760 hours.   Lipid Panel No results found for: CHOL, TRIG, HDL, CHOLHDL, VLDL, LDLCALC, LDLDIRECT   Other studies Reviewed: Additional studies/ records that were reviewed today with results demonstrating: LDL 67.   ASSESSMENT AND PLAN:  1. Shortness of breath: Some RF for CAD.  Could be anginal equivalent.  We will plan for exercise Myoview.  No obvious volume overload on exam. 2. RBBB: Downsloping ST segments in lead III, V1 and V2.  Regular treadmill test would be difficult to interpret. 3. Obesity: He would benefit from whole food, plant-based diet.  Avoid processed foods.   Current medicines are reviewed at length with the patient today.  The patient concerns regarding his medicines were addressed.  The following changes have been made:  No change  Labs/ tests ordered today include:  No orders of the defined types were placed in this  encounter.   Recommend 150 minutes/week of aerobic exercise Low fat, low carb, high fiber diet recommended  Disposition:   FU in for test.  If shortness of breath persists, could consider echocardiogram as well as BNP check for diastolic heart failure/subtle volume overload.   Signed, Larae Grooms, MD  06/22/2020 2:22 PM    [facility]  [address] [place], [place]  [address] Phone: [phone]; Fax: [phone]

## 2020-06-22 NOTE — Patient Instructions (Signed)
Medication Instructions:  Your physician recommends that you continue on your current medications as directed. Please refer to the Current Medication list given to you today.  *If you need a refill on your cardiac medications before your next appointment, please call your pharmacy*   Lab Work: none If you have labs (blood work) drawn today and your tests are completely normal, you will receive your results only by: MyChart Message (if you have MyChart) OR A paper copy in the mail If you have any lab test that is abnormal or we need to change your treatment, we will call you to review the results.   Testing/Procedures: Your physician has requested that you have an exercise stress myoview. For further information please visit [email or web address]. Please follow instruction sheet, as given.    Follow-Up: At [facility], you and your health needs are our priority.  As part of our continuing mission to provide you with exceptional heart care, we have created designated Provider Care Teams.  These Care Teams include your primary Cardiologist (physician) and Advanced Practice Providers (APPs -  Physician Assistants and Nurse Practitioners) who all work together to provide you with the care you need, when you need it.  We recommend signing up for the patient portal called "MyChart".  Sign up information is provided on this After Visit Summary.  MyChart is used to connect with patients for Virtual Visits (Telemedicine).  Patients are able to view lab/test results, encounter notes, upcoming appointments, etc.  Non-urgent messages can be sent to your provider as well.   To learn more about what you can do with MyChart, go to [email or web address].    Your next appointment:   6 month(s)  The format for your next appointment:   In Person  Provider:   You may see Jayadeep Varanasi, MD or one of the following Advanced Practice Providers on your designated Care Team:   Dayna Dunn, PA-C Michele  Lenze, PA-C   Other Instructions   

## 2020-06-27 ENCOUNTER — Telehealth (HOSPITAL_COMMUNITY): Payer: Self-pay | Admitting: *Deleted

## 2020-06-27 NOTE — Telephone Encounter (Signed)
Patient given detailed instructions per Myocardial Perfusion Study Information Sheet for the test on 06/29/20  Patient notified to arrive 15 minutes early and that it is imperative to arrive on time for appointment to keep from having the test rescheduled.  If you need to cancel or reschedule your appointment, please call the office within 24 hours of your appointment. . Patient verbalized understanding.  Joshua Young    

## 2020-06-29 ENCOUNTER — Ambulatory Visit (HOSPITAL_COMMUNITY): Payer: PPO | Attending: Cardiovascular Disease

## 2020-06-29 ENCOUNTER — Other Ambulatory Visit: Payer: Self-pay

## 2020-06-29 DIAGNOSIS — R9431 Abnormal electrocardiogram [ECG] [EKG]: Secondary | ICD-10-CM | POA: Diagnosis not present

## 2020-06-29 DIAGNOSIS — R0602 Shortness of breath: Secondary | ICD-10-CM

## 2020-06-29 LAB — MYOCARDIAL PERFUSION IMAGING
Estimated workload: 8.9 METS
Exercise duration (min): 7 min
Exercise duration (sec): 15 s
LV dias vol: 91 mL (ref 62–150)
LV sys vol: 28 mL
MPHR: 152 {beats}/min
Peak HR: 134 {beats}/min
Percent HR: 88 %
Rest HR: 57 {beats}/min
SDS: 2
SRS: 0
SSS: 2
TID: 0.8

## 2020-06-29 MED ORDER — TECHNETIUM TC 99M TETROFOSMIN IV KIT
10.3000 | PACK | Freq: Once | INTRAVENOUS | Status: AC | PRN
  Administered 2020-06-29: 10.3 via INTRAVENOUS
  Filled 2020-06-29: qty 11

## 2020-06-29 MED ORDER — REGADENOSON 0.4 MG/5ML IV SOLN: 0.4000 mg | Freq: Once | INTRAVENOUS | Status: AC

## 2020-06-29 MED ORDER — TECHNETIUM TC 99M TETROFOSMIN IV KIT
30.4000 | PACK | Freq: Once | INTRAVENOUS | Status: AC | PRN
  Administered 2020-06-29: 30.4 via INTRAVENOUS
  Filled 2020-06-29: qty 31

## 2020-07-27 ENCOUNTER — Other Ambulatory Visit: Payer: Self-pay

## 2020-07-27 ENCOUNTER — Other Ambulatory Visit (HOSPITAL_BASED_OUTPATIENT_CLINIC_OR_DEPARTMENT_OTHER): Payer: Self-pay

## 2020-07-27 ENCOUNTER — Ambulatory Visit: Payer: PPO | Attending: Internal Medicine

## 2020-07-27 DIAGNOSIS — Z23 Encounter for immunization: Secondary | ICD-10-CM

## 2020-07-27 MED ORDER — PFIZER-BIONT COVID-19 VAC-TRIS 30 MCG/0.3ML IM SUSP
INTRAMUSCULAR | 0 refills | Status: AC
  Filled 2020-07-27: qty 0.3, 1d supply, fill #0

## 2020-07-27 NOTE — Progress Notes (Signed)
   Covid-19 Vaccination Clinic  Name:  Joshua Young    MRN: [id number] DOB: 1950-04-21  07/27/2020  Mr. Joshua Young was observed post Covid-19 immunization for 15 minutes without incident. He was provided with Vaccine Information Sheet and instruction to access the V-Safe system.   Mr. Joshua Young was instructed to call 911 with any severe reactions post vaccine: Difficulty breathing  Swelling of face and throat  A fast heartbeat  A bad rash all over body  Dizziness and weakness   Immunizations Administered     Name Date Dose VIS Date Route   PFIZER Comrnaty([place]) Covid-19 Vaccine 07/27/2020  2:59 PM 0.3 mL 01/21/2020 Intramuscular   Manufacturer: [place]   Lot: [id number]   [name]: [phone]

## 2020-07-29 DIAGNOSIS — M25562 Pain in left knee: Secondary | ICD-10-CM | POA: Diagnosis not present

## 2020-07-29 DIAGNOSIS — M25561 Pain in right knee: Secondary | ICD-10-CM | POA: Diagnosis not present

## 2020-08-11 DIAGNOSIS — Z20822 Contact with and (suspected) exposure to covid-19: Secondary | ICD-10-CM | POA: Diagnosis not present

## 2020-08-26 DIAGNOSIS — M25562 Pain in left knee: Secondary | ICD-10-CM | POA: Diagnosis not present

## 2020-09-19 ENCOUNTER — Other Ambulatory Visit (HOSPITAL_COMMUNITY): Payer: Self-pay

## 2020-10-07 DIAGNOSIS — D3131 Benign neoplasm of right choroid: Secondary | ICD-10-CM | POA: Diagnosis not present

## 2020-10-07 DIAGNOSIS — H524 Presbyopia: Secondary | ICD-10-CM | POA: Diagnosis not present

## 2020-10-07 DIAGNOSIS — D3132 Benign neoplasm of left choroid: Secondary | ICD-10-CM | POA: Diagnosis not present

## 2020-10-18 DIAGNOSIS — Z8546 Personal history of malignant neoplasm of prostate: Secondary | ICD-10-CM | POA: Diagnosis not present

## 2020-10-25 DIAGNOSIS — N5201 Erectile dysfunction due to arterial insufficiency: Secondary | ICD-10-CM | POA: Diagnosis not present

## 2020-10-25 DIAGNOSIS — Z8546 Personal history of malignant neoplasm of prostate: Secondary | ICD-10-CM | POA: Diagnosis not present

## 2020-12-08 DIAGNOSIS — Z23 Encounter for immunization: Secondary | ICD-10-CM | POA: Diagnosis not present

## 2020-12-21 NOTE — Progress Notes (Signed)
Cardiology Office Note   Date:  12/22/2020   ID:  Joshua Young, DOB Nov 16, 1950, MRN [id number]  PCP:  Antony Contras, MD    No chief complaint on file.  DOE  [facility] Readings from Last 3 Encounters:  12/22/20 232 lb 12.8 oz (105.6 kg)  06/29/20 236 lb (107 kg)  06/22/20 236 lb 12.8 oz (107.4 kg)       History of Present Illness: Joshua Young is a 70 y.o. male  who had SHOB in early 2022.   "He states that people around him noticed that he is breathing hard.  He walks a lot of stairs at work and this is the usual time he hears these comments.   He feels some SHOB when he lies down at night.  He sleeps on an incline due to acid reflux.     No chest pain.  DOE after 1 flight of stairs.     REtired from Investment banker, corporate.    No prior cardiac w/u.    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  Normal LVEF at that time.    Walks a lot.  Works as a Neurosurgeon as well. "  Stress test in 06/2020: "The left ventricular ejection fraction is hyperdynamic (>65%). Nuclear stress EF: 69%. Blood pressure demonstrated a hypertensive response to exercise. There was no ST segment deviation noted during stress. The study is normal. This is a low risk study.   Normal resting and stress perfusion. No ischemia or infarction EF 69%"  Retired since the last visit.  He is walking more.  Feels better. Wife had colectomy.  Been caring for his mother who is blind and his wife.  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   GERD (gastroesophageal reflux disease)    takes Omeprazole daily   Gout    tried taking Allopurinol but decided not to take it   Headache(784.0)    occasionally   History of blood clots    right calf(2008) and last yr in left leg put on Xarelto and went into right lung   History of colon  polyps    History of kidney stones early 90's   Hypertension    takes Losartan daily   Joint pain    PONV (postoperative nausea and vomiting)    Prostate cancer ([place]) 04/07/10   gleason 8, volume 30.069cc   S/P radiation therapy 4-12 wks ago 9/12-10-12   prostate   Seasonal allergies    takes Allegra daily as needed as well as using Nasonex as needed    Past Surgical History:  Procedure Laterality Date   COLONOSCOPY     CYSTOSCOPY WITH LITHOLAPAXY N/A 10/25/2016   Procedure: CYSTOSCOPY WITH LITHOLAPAXY;  Surgeon: Raynelle Bring, MD;  Location: [facility];  Service: Urology;  Laterality: N/A;  NEED 45 MIN TOTAL   prostatecomy     complete   VASECTOMY       Current Outpatient Medications  Medication Sig Dispense Refill   Cholecalciferol (VITAMIN D) 2000 units CAPS Take 2,000 Units by mouth daily.     COVID-19 mRNA Vac-TriS, Pfizer, (PFIZER-BIONT COVID-19 VAC-TRIS) SUSP injection Inject into the muscle. 0.3 mL 0   fexofenadine (ALLEGRA) 180 MG tablet Take 180 mg by mouth daily as needed for allergies.  losartan (COZAAR) 100 MG tablet Take 100 mg by mouth daily.     Misc Natural Products (TART CHERRY ADVANCED) CAPS Take 1 capsule by mouth daily.     mometasone (NASONEX) 50 MCG/ACT nasal spray Place 2 sprays into the nose daily as needed (for congestion).      Multiple Vitamins-Minerals (CENTRUM SILVER PO) Take 1 tablet by mouth daily.     OVER THE COUNTER MEDICATION Take 1 tablet by mouth daily. Uric acid flush     OVER THE COUNTER MEDICATION Take 1 tablet by mouth daily.     pantoprazole (PROTONIX) 40 MG tablet Take 40 mg by mouth 2 (two) times daily.     phenazopyridine (PYRIDIUM) 100 MG tablet Take 1 tablet (100 mg total) by mouth 3 (three) times daily as needed for pain (for burning). 20 tablet 0   rivaroxaban (XARELTO) 20 MG TABS tablet Take 20 mg by mouth daily.      vitamin C (ASCORBIC ACID) 500 MG tablet Take 500 mg by mouth daily.     No current facility-administered  medications for this visit.   Facility-Administered Medications Ordered in Other Visits  Medication Dose Route Frequency Provider Last Rate Last Admin   lactated ringers infusion    Continuous PRN Rogers Blocker, CRNA   New Bag at 09/24/13 0700   regadenoson (LEXISCAN) injection SOLN 0.4 mg  0.4 mg Intravenous Once Josue Hector, MD        Allergies:   Allopurinol, Other, and Shellfish allergy    Social History:  The patient  reports that he has never smoked. He has never used smokeless tobacco. He reports that he does not drink alcohol and does not use drugs.   Family History:  The patient's family history includes Cancer in his father.    ROS:  Please see the history of present illness.   Otherwise, review of systems are positive for intentional weight loss.   All other systems are reviewed and negative.    PHYSICAL EXAM: VS:  BP 140/74   Pulse 70   Ht 5\' 8"  (1.727 m)   Wt 232 lb 12.8 oz (105.6 kg)   SpO2 95%   BMI 35.40 kg/m  , BMI Body mass index is 35.4 kg/m. GEN: Well nourished, well developed, in no acute distress HEENT: normal Neck: no JVD, carotid bruits, or masses Cardiac: RRR; no murmurs, rubs, or gallops,no edema  Respiratory:  clear to auscultation bilaterally, normal work of breathing GI: soft, nontender, nondistended, + BS; umbilical hernia s/p prostate surgery MS: no deformity or atrophy; compression stockings in place after DVT Skin: warm and dry, no rash Neuro:  Strength and sensation are intact Psych: euthymic mood, full affect   EKG:   The ekg ordered 06/2020 demonstrates NSR, RBBB   Recent Labs: No results found for requested labs within last 8760 hours.   Lipid Panel No results found for: CHOL, TRIG, HDL, CHOLHDL, VLDL, LDLCALC, LDLDIRECT   Other studies Reviewed: Additional studies/ records that were reviewed today with results demonstrating: labs reviewed.  No mention of coronary calcium on his 2014 chest CT.  LDL 67 in March  2022.   ASSESSMENT AND PLAN:  Shortness of breath: Prior PE.  No SHOB except for allergies.  Feels better since retiring. Abnormal ECG: RBBB noted in 06/2020. RBBB: not noted on 2018 ECG.  Morbid Obesity: Improved quality of food since retiring.  Stopped drinking sodas.  Avoid processed foods.  Whole food, plant-based diet.  Increase fiber intake.  HTN: Systolics in the 742V when checking at home. Continue healthy lifestyle.  Avoid excessive salt. Hepatic steatosis: Noted by the CT scan in 2014.  Sticking to a whole food, food plant-based diet will be helpful.   Current medicines are reviewed at length with the patient today.  The patient concerns regarding his medicines were addressed.  The following changes have been made:  No change  Labs/ tests ordered today include:  No orders of the defined types were placed in this encounter.   Recommend 150 minutes/week of aerobic exercise Low fat, low carb, high fiber diet recommended  Disposition:   FU in 1 year   Signed, Larae Grooms, MD  12/22/2020 11:36 AM    [facility] [address], [place], [place]  [address] Phone: [phone]; Fax: [phone]

## 2020-12-22 ENCOUNTER — Other Ambulatory Visit: Payer: Self-pay

## 2020-12-22 ENCOUNTER — Encounter: Payer: Self-pay | Admitting: Interventional Cardiology

## 2020-12-22 ENCOUNTER — Ambulatory Visit: Payer: PPO | Admitting: Interventional Cardiology

## 2020-12-22 VITALS — BP 140/74 | HR 70 | Ht 68.0 in | Wt 232.8 lb

## 2020-12-22 DIAGNOSIS — R0602 Shortness of breath: Secondary | ICD-10-CM

## 2020-12-22 DIAGNOSIS — I4510 Unspecified right bundle-branch block: Secondary | ICD-10-CM | POA: Diagnosis not present

## 2020-12-22 DIAGNOSIS — K760 Fatty (change of) liver, not elsewhere classified: Secondary | ICD-10-CM

## 2020-12-22 DIAGNOSIS — R9431 Abnormal electrocardiogram [ECG] [EKG]: Secondary | ICD-10-CM | POA: Diagnosis not present

## 2020-12-22 DIAGNOSIS — E669 Obesity, unspecified: Secondary | ICD-10-CM

## 2020-12-22 NOTE — Patient Instructions (Signed)
Medication Instructions:  Your physician recommends that you continue on your current medications as directed. Please refer to the Current Medication list given to you today.  *If you need a refill on your cardiac medications before your next appointment, please call your pharmacy*   Lab Work: none If you have labs (blood work) drawn today and your tests are completely normal, you will receive your results only by: MyChart Message (if you have MyChart) OR A paper copy in the mail If you have any lab test that is abnormal or we need to change your treatment, we will call you to review the results.   Testing/Procedures: none   Follow-Up: At [facility], you and your health needs are our priority.  As part of our continuing mission to provide you with exceptional heart care, we have created designated Provider Care Teams.  These Care Teams include your primary Cardiologist (physician) and Advanced Practice Providers (APPs -  Physician Assistants and Nurse Practitioners) who all work together to provide you with the care you need, when you need it.  We recommend signing up for the patient portal called "MyChart".  Sign up information is provided on this After Visit Summary.  MyChart is used to connect with patients for Virtual Visits (Telemedicine).  Patients are able to view lab/test results, encounter notes, upcoming appointments, etc.  Non-urgent messages can be sent to your provider as well.   To learn more about what you can do with MyChart, go to [email or web address].    Your next appointment:   12 month(s)  The format for your next appointment:   In Person  Provider:   Jayadeep Varanasi, MD     Other Instructions  High-Fiber Eating Plan Fiber, also called dietary fiber, is a type of carbohydrate. It is found foods such as fruits, vegetables, whole grains, and beans. A high-fiber diet can have many health benefits. Your health care provider may recommend a high-fiber diet  to help: Prevent constipation. Fiber can make your bowel movements more regular. Lower your cholesterol. Relieve the following conditions: Inflammation of veins in the anus (hemorrhoids). Inflammation of specific areas of the digestive tract (uncomplicated diverticulosis). A problem of the large intestine, also called the colon, that sometimes causes pain and diarrhea (irritable bowel syndrome, or IBS). Prevent overeating as part of a weight-loss plan. Prevent heart disease, type 2 diabetes, and certain cancers. What are tips for following this plan? Reading food labels  Check the nutrition facts label on food products for the amount of dietary fiber. Choose foods that have 5 grams of fiber or more per serving. The goals for recommended daily fiber intake include: Men (age 50 or younger): 34-38 g. Men (over age 50): 28-34 g. Women (age 50 or younger): 25-28 g. Women (over age 50): 22-25 g. Your daily fiber goal is _____________ g. Shopping Choose whole fruits and vegetables instead of processed forms, such as apple juice or applesauce. Choose a wide variety of high-fiber foods such as avocados, lentils, oats, and kidney beans. Read the nutrition facts label of the foods you choose. Be aware of foods with added fiber. These foods often have high sugar and sodium amounts per serving. Cooking Use whole-grain flour for baking and cooking. Cook with brown rice instead of white rice. Meal planning Start the day with a breakfast that is high in fiber, such as a cereal that contains 5 g of fiber or more per serving. Eat breads and cereals that are made with whole-grain flour instead of   refined flour or white flour. Eat brown rice, bulgur wheat, or millet instead of white rice. Use beans in place of meat in soups, salads, and pasta dishes. Be sure that half of the grains you eat each day are whole grains. General information You can get the recommended daily intake of dietary fiber  by: Eating a variety of fruits, vegetables, grains, nuts, and beans. Taking a fiber supplement if you are not able to take in enough fiber in your diet. It is better to get fiber through food than from a supplement. Gradually increase how much fiber you consume. If you increase your intake of dietary fiber too quickly, you may have bloating, cramping, or gas. Drink plenty of water to help you digest fiber. Choose high-fiber snacks, such as berries, raw vegetables, nuts, and popcorn. What foods should I eat? Fruits Berries. Pears. Apples. Oranges. Avocado. Prunes and raisins. Dried figs. Vegetables Sweet potatoes. Spinach. Kale. Artichokes. Cabbage. Broccoli. Cauliflower. Green peas. Carrots. Squash. Grains Whole-grain breads. Multigrain cereal. Oats and oatmeal. Brown rice. Barley. Bulgur wheat. Millet. Quinoa. Bran muffins. Popcorn. Rye wafer crackers. Meats and other proteins Navy beans, kidney beans, and pinto beans. Soybeans. Split peas. Lentils. Nuts and seeds. Dairy Fiber-fortified yogurt. Beverages Fiber-fortified soy milk. Fiber-fortified orange juice. Other foods Fiber bars. The items listed above may not be a complete list of recommended foods and beverages. Contact a dietitian for more information. What foods should I avoid? Fruits Fruit juice. Cooked, strained fruit. Vegetables Fried potatoes. Canned vegetables. Well-cooked vegetables. Grains White bread. Pasta made with refined flour. White rice. Meats and other proteins Fatty cuts of meat. Fried chicken or fried fish. Dairy Milk. Yogurt. Cream cheese. Sour cream. Fats and oils Butters. Beverages Soft drinks. Other foods Cakes and pastries. The items listed above may not be a complete list of foods and beverages to avoid. Talk with your dietitian about what choices are best for you. Summary Fiber is a type of carbohydrate. It is found in foods such as fruits, vegetables, whole grains, and beans. A high-fiber  diet has many benefits. It can help to prevent constipation, lower blood cholesterol, aid weight loss, and reduce your risk of heart disease, diabetes, and certain cancers. Increase your intake of fiber gradually. Increasing fiber too quickly may cause cramping, bloating, and gas. Drink plenty of water while you increase the amount of fiber you consume. The best sources of fiber include whole fruits and vegetables, whole grains, nuts, seeds, and beans. This information is not intended to replace advice given to you by your health care provider. Make sure you discuss any questions you have with your health care provider. Document Revised: 06/04/2019 Document Reviewed: 06/04/2019 Elsevier Patient Education  2022 Elsevier Inc.   

## 2021-03-06 ENCOUNTER — Other Ambulatory Visit (HOSPITAL_COMMUNITY): Payer: Self-pay

## 2021-03-15 DIAGNOSIS — I10 Essential (primary) hypertension: Secondary | ICD-10-CM | POA: Diagnosis not present

## 2021-03-15 DIAGNOSIS — I2782 Chronic pulmonary embolism: Secondary | ICD-10-CM | POA: Diagnosis not present

## 2021-03-15 DIAGNOSIS — J309 Allergic rhinitis, unspecified: Secondary | ICD-10-CM | POA: Diagnosis not present

## 2021-03-15 DIAGNOSIS — M109 Gout, unspecified: Secondary | ICD-10-CM | POA: Diagnosis not present

## 2021-03-15 DIAGNOSIS — E669 Obesity, unspecified: Secondary | ICD-10-CM | POA: Diagnosis not present

## 2021-03-15 DIAGNOSIS — G620 Drug-induced polyneuropathy: Secondary | ICD-10-CM | POA: Diagnosis not present

## 2021-03-15 DIAGNOSIS — K219 Gastro-esophageal reflux disease without esophagitis: Secondary | ICD-10-CM | POA: Diagnosis not present

## 2021-03-15 DIAGNOSIS — Z7901 Long term (current) use of anticoagulants: Secondary | ICD-10-CM | POA: Diagnosis not present

## 2021-05-08 DIAGNOSIS — Z86711 Personal history of pulmonary embolism: Secondary | ICD-10-CM | POA: Diagnosis not present

## 2021-05-08 DIAGNOSIS — I10 Essential (primary) hypertension: Secondary | ICD-10-CM | POA: Diagnosis not present

## 2021-05-08 DIAGNOSIS — R7303 Prediabetes: Secondary | ICD-10-CM | POA: Diagnosis not present

## 2021-05-08 DIAGNOSIS — Z6836 Body mass index (BMI) 36.0-36.9, adult: Secondary | ICD-10-CM | POA: Diagnosis not present

## 2021-05-08 DIAGNOSIS — M179 Osteoarthritis of knee, unspecified: Secondary | ICD-10-CM | POA: Diagnosis not present

## 2021-05-08 DIAGNOSIS — Z8546 Personal history of malignant neoplasm of prostate: Secondary | ICD-10-CM | POA: Diagnosis not present

## 2021-05-08 DIAGNOSIS — E559 Vitamin D deficiency, unspecified: Secondary | ICD-10-CM | POA: Diagnosis not present

## 2021-05-08 DIAGNOSIS — E7800 Pure hypercholesterolemia, unspecified: Secondary | ICD-10-CM | POA: Diagnosis not present

## 2021-05-08 DIAGNOSIS — D6869 Other thrombophilia: Secondary | ICD-10-CM | POA: Diagnosis not present

## 2021-05-08 DIAGNOSIS — M109 Gout, unspecified: Secondary | ICD-10-CM | POA: Diagnosis not present

## 2021-05-08 DIAGNOSIS — Z0000 Encounter for general adult medical examination without abnormal findings: Secondary | ICD-10-CM | POA: Diagnosis not present

## 2021-05-15 DIAGNOSIS — H1033 Unspecified acute conjunctivitis, bilateral: Secondary | ICD-10-CM | POA: Diagnosis not present

## 2021-05-15 DIAGNOSIS — J0140 Acute pansinusitis, unspecified: Secondary | ICD-10-CM | POA: Diagnosis not present

## 2021-06-01 DIAGNOSIS — I10 Essential (primary) hypertension: Secondary | ICD-10-CM | POA: Diagnosis not present

## 2021-06-01 DIAGNOSIS — J3089 Other allergic rhinitis: Secondary | ICD-10-CM | POA: Diagnosis not present

## 2021-10-09 DIAGNOSIS — H524 Presbyopia: Secondary | ICD-10-CM | POA: Diagnosis not present

## 2021-10-09 DIAGNOSIS — D3131 Benign neoplasm of right choroid: Secondary | ICD-10-CM | POA: Diagnosis not present

## 2021-10-09 DIAGNOSIS — D3132 Benign neoplasm of left choroid: Secondary | ICD-10-CM | POA: Diagnosis not present

## 2021-11-02 ENCOUNTER — Telehealth: Payer: Self-pay

## 2021-11-02 NOTE — Patient Instructions (Signed)
Visit Information  Thank you for taking time to visit with me today. Please don't hesitate to contact me if I can be of assistance to you.   Following are the goals we discussed today:   Goals Addressed             This Visit's Progress    COMPLETED: Care Coordination Activities-No follow up required       Care Coordination Interventions: Advised patient to schedule annual wellness visit. Patient to follow up.          If you are experiencing a Mental Health or [place] or need someone to talk to, please call the Suicide and Crisis Lifeline: 988   Patient verbalizes understanding of instructions and care plan provided today and agrees to view in [place]. Active MyChart status and patient understanding of how to access instructions and care plan via MyChart confirmed with patient.     No further follow up required: patient declines at this time  Jone Baseman, RN, MSN [facility] Care Management Coordinator Direct Line [phone]

## 2021-11-02 NOTE — Patient Outreach (Signed)
  Care Coordination   Initial Visit Note   11/02/2021 Name: Joshua Young MRN: [id number] DOB: 05-Apr-1950  Joshua Young is a 71 y.o. year old male who sees Antony Contras, MD for primary care. I spoke with  Barrie Dunker by phone today.  What matters to the patients health and wellness today?  None at this time    Goals Addressed             This Visit's Progress    COMPLETED: Care Coordination Activities-No follow up required       Care Coordination Interventions: Advised patient to schedule annual wellness visit. Patient to follow up.          SDOH assessments and interventions completed:  No     Care Coordination Interventions Activated:  Yes  Care Coordination Interventions:  Yes, provided   Follow up plan: No further intervention required.   Encounter Outcome:  Pt. Visit Completed   Jone Baseman, RN, MSN [facility] Care Management Coordinator Direct Line [phone]

## 2021-12-08 DIAGNOSIS — M25551 Pain in right hip: Secondary | ICD-10-CM | POA: Diagnosis not present

## 2021-12-08 DIAGNOSIS — M25552 Pain in left hip: Secondary | ICD-10-CM | POA: Diagnosis not present

## 2021-12-08 DIAGNOSIS — M545 Low back pain, unspecified: Secondary | ICD-10-CM | POA: Diagnosis not present

## 2021-12-26 DIAGNOSIS — M5451 Vertebrogenic low back pain: Secondary | ICD-10-CM | POA: Diagnosis not present

## 2022-01-02 DIAGNOSIS — M545 Low back pain, unspecified: Secondary | ICD-10-CM | POA: Diagnosis not present

## 2022-01-03 DIAGNOSIS — D49512 Neoplasm of unspecified behavior of left kidney: Secondary | ICD-10-CM | POA: Diagnosis not present

## 2022-01-16 ENCOUNTER — Other Ambulatory Visit: Payer: Self-pay | Admitting: Urology

## 2022-01-16 DIAGNOSIS — D49519 Neoplasm of unspecified behavior of unspecified kidney: Secondary | ICD-10-CM

## 2022-01-16 DIAGNOSIS — I2699 Other pulmonary embolism without acute cor pulmonale: Secondary | ICD-10-CM

## 2022-01-30 ENCOUNTER — Ambulatory Visit
Admission: RE | Admit: 2022-01-30 | Discharge: 2022-01-30 | Disposition: A | Discharge status: Home / Self Care | Payer: PPO | Source: Ambulatory Visit | Attending: Urology | Admitting: Urology

## 2022-01-30 DIAGNOSIS — K760 Fatty (change of) liver, not elsewhere classified: Secondary | ICD-10-CM | POA: Diagnosis not present

## 2022-01-30 DIAGNOSIS — D49519 Neoplasm of unspecified behavior of unspecified kidney: Secondary | ICD-10-CM

## 2022-01-30 DIAGNOSIS — K3189 Other diseases of stomach and duodenum: Secondary | ICD-10-CM | POA: Diagnosis not present

## 2022-01-30 DIAGNOSIS — Z8546 Personal history of malignant neoplasm of prostate: Secondary | ICD-10-CM | POA: Diagnosis not present

## 2022-01-30 DIAGNOSIS — N281 Cyst of kidney, acquired: Secondary | ICD-10-CM | POA: Diagnosis not present

## 2022-01-30 MED ORDER — GADOBENATE DIMEGLUMINE 529 MG/ML IV SOLN
20.0000 mL | Freq: Once | INTRAVENOUS | Status: AC | PRN
  Administered 2022-01-30: 20 mL via INTRAVENOUS

## 2022-02-01 ENCOUNTER — Other Ambulatory Visit: Payer: PPO

## 2022-03-26 DIAGNOSIS — R933 Abnormal findings on diagnostic imaging of other parts of digestive tract: Secondary | ICD-10-CM | POA: Diagnosis not present

## 2022-03-26 DIAGNOSIS — K219 Gastro-esophageal reflux disease without esophagitis: Secondary | ICD-10-CM | POA: Diagnosis not present

## 2022-04-04 NOTE — Progress Notes (Unsigned)
Cardiology Office Note   Date:  04/05/2022   ID:  Joshua Young, DOB 15-Jul-1950, MRN [id number]  PCP:  Joshua Young    No chief complaint on file.  Dyspnea on exertion  Wt Readings from Last 3 Encounters:  04/05/22 239 lb 3.2 oz (108.5 kg)  12/22/20 232 lb 12.8 oz (105.6 kg)  06/29/20 236 lb (107 kg)       History of Present Illness: Joshua Young is a 72 y.o. male  who had SHOB in early 2022.    "He states that people around him noticed that he is breathing hard.  He walks a lot of stairs at work and this is the usual time he hears these comments.   He feels some SHOB when he lies down at night.  He sleeps on an incline due to acid reflux.     No chest pain.  DOE after 1 flight of stairs.     REtired from Investment banker, corporate.    No prior cardiac w/u.    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  Normal LVEF at that time.    Walks a lot.  Works as a Neurosurgeon as well. "   Stress test in 06/2020: "The left ventricular ejection fraction is hyperdynamic (>65%). Nuclear stress EF: 69%. Blood pressure demonstrated a hypertensive response to exercise. There was no ST segment deviation noted during stress. The study is normal. This is a low risk study.   Normal resting and stress perfusion. No ischemia or infarction EF 69%"   Retired in 2022.  In 2022"  He is walking more.  Feels better. Wife had colectomy.  Been caring for his mother who is blind and his wife."  As of 2024, wife has improved.    Ventral hernia has increased in size.   Denies : Chest pain. Dizziness. Leg edema. Nitroglycerin use. Orthopnea. Palpitations. Paroxysmal nocturnal dyspnea. Shortness of breath. Syncope.     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   GERD (gastroesophageal reflux disease)    takes  Omeprazole daily   Gout    tried taking Allopurinol but decided not to take it   Headache(784.0)    occasionally   History of blood clots    right calf(2008) and last yr in left leg put on Xarelto and went into right lung   History of colon polyps    History of kidney stones early 90's   Hypertension    takes Losartan daily   Joint pain    PONV (postoperative nausea and vomiting)    Prostate cancer ([place]) 04/07/10   gleason 8, volume 30.069cc   S/P radiation therapy 4-12 wks ago 9/12-10-12   prostate   Seasonal allergies    takes Allegra daily as needed as well as using Nasonex as needed    Past Surgical History:  Procedure Laterality Date   COLONOSCOPY     CYSTOSCOPY WITH LITHOLAPAXY N/A 10/25/2016   Procedure: CYSTOSCOPY WITH LITHOLAPAXY;  Surgeon: Joshua Bring, Young;  Location: [facility];  Service: Urology;  Laterality: N/A;  NEED 45 MIN TOTAL   prostatecomy     complete   VASECTOMY       Current Outpatient Medications  Medication Sig Dispense Refill   amLODipine (NORVASC) 2.5 MG tablet  Take 2.5 mg by mouth daily.     Cholecalciferol (VITAMIN D) 2000 units CAPS Take 2,000 Units by mouth daily.     COVID-19 mRNA Vac-TriS, Pfizer, (PFIZER-BIONT COVID-19 VAC-TRIS) SUSP injection Inject into the muscle. 0.3 mL 0   fexofenadine (ALLEGRA) 180 MG tablet Take 180 mg by mouth daily as needed for allergies.     losartan (COZAAR) 100 MG tablet Take 100 mg by mouth daily.     Misc Natural Products (TART CHERRY ADVANCED) CAPS Take 1 capsule by mouth daily.     mometasone (NASONEX) 50 MCG/ACT nasal spray Place 2 sprays into the nose daily as needed (for congestion).      Multiple Vitamins-Minerals (CENTRUM SILVER PO) Take 1 tablet by mouth daily.     OVER THE COUNTER MEDICATION Take 1 tablet by mouth daily. Uric acid flush     OVER THE COUNTER MEDICATION Take 1 tablet by mouth daily.     pantoprazole (PROTONIX) 40 MG tablet Take 40 mg by mouth 2 (two) times daily.     rivaroxaban  (XARELTO) 20 MG TABS tablet Take 20 mg by mouth daily.      vitamin C (ASCORBIC ACID) 500 MG tablet Take 500 mg by mouth daily.     No current facility-administered medications for this visit.   Facility-Administered Medications Ordered in Other Visits  Medication Dose Route Frequency Provider Last Rate Last Admin   lactated ringers infusion    Continuous PRN Joshua Blocker, CRNA   New Bag at 09/24/13 0700   regadenoson (LEXISCAN) injection SOLN 0.4 mg  0.4 mg Intravenous Once Joshua Hector, Young        Allergies:   Allopurinol, Other, and Shellfish allergy    Social History:  The patient  reports that he has never smoked. He has never used smokeless tobacco. He reports that he does not drink alcohol and does not use drugs.   Family History:  The patient's family history includes Cancer in his father.    ROS:  Please see the history of present illness.   Otherwise, review of systems are positive for chronic shortness of breath.   All other systems are reviewed and negative.    PHYSICAL EXAM: VS:  BP 118/78   Pulse 74   Ht 5' 8"$  (1.727 m)   Wt 239 lb 3.2 oz (108.5 kg)   SpO2 97%   BMI 36.37 kg/m  , BMI Body mass index is 36.37 kg/m. GEN: Well nourished, well developed, in no acute distress HEENT: normal Neck: no JVD, carotid bruits, or masses Cardiac: RRR; no murmurs, rubs, or gallops,no edema  Respiratory:  clear to auscultation bilaterally, normal work of breathing GI: soft, nontender, nondistended, + BS, ventral hernia MS: no deformity or atrophy Skin: warm and dry, no rash Neuro:  Strength and sensation are intact Psych: euthymic mood, full affect   EKG:   The ekg ordered today demonstrates NSR, RBBB   Recent Labs: No results found for requested labs within last 365 days.   Lipid Panel No results found for: "CHOL", "TRIG", "HDL", "CHOLHDL", "VLDL", "LDLCALC", "LDLDIRECT"   Other studies Reviewed: Additional studies/ records that were reviewed today with  results demonstrating: labs reviewed.   ASSESSMENT AND PLAN:  Chronic shortness of breath: Prior pulmonary embolism.  Stable.  Abnormal ECG: Right bundle branch block. Morbid obesity: Food, plant-based diet.  High-fiber diet.  Avoid processed foods.  Exercise target as noted below. Hypertension: Low-salt diet.  Weight loss will be beneficial.  The current medical regimen is effective;  continue present plan and medications. Hepatic steatosis: We talked about plant-based, high-fiber diet as noted above Prediabetes : A1c 6.0 in March 2023. He is more active.     Current medicines are reviewed at length with the patient today.  The patient concerns regarding his medicines were addressed.  The following changes have been made:  No change  Labs/ tests ordered today include:  No orders of the defined types were placed in this encounter.   Recommend 150 minutes/week of aerobic exercise Low fat, low carb, high fiber diet recommended  Disposition:   FU in 1 year   Signed, Larae Grooms, Young  04/05/2022 3:35 PM    [facility] [address], [place], [place]  [address] Phone: [phone]; Fax: [phone]

## 2022-04-05 ENCOUNTER — Encounter: Payer: Self-pay | Admitting: Interventional Cardiology

## 2022-04-05 ENCOUNTER — Ambulatory Visit: Payer: PPO | Attending: Interventional Cardiology | Admitting: Interventional Cardiology

## 2022-04-05 VITALS — BP 118/78 | HR 74 | Ht 68.0 in | Wt 239.2 lb

## 2022-04-05 DIAGNOSIS — E669 Obesity, unspecified: Secondary | ICD-10-CM | POA: Diagnosis not present

## 2022-04-05 DIAGNOSIS — R0602 Shortness of breath: Secondary | ICD-10-CM

## 2022-04-05 DIAGNOSIS — I4510 Unspecified right bundle-branch block: Secondary | ICD-10-CM | POA: Diagnosis not present

## 2022-04-05 DIAGNOSIS — K760 Fatty (change of) liver, not elsewhere classified: Secondary | ICD-10-CM

## 2022-04-05 DIAGNOSIS — R9431 Abnormal electrocardiogram [ECG] [EKG]: Secondary | ICD-10-CM

## 2022-04-05 NOTE — Patient Instructions (Signed)
Medication Instructions:  Your physician recommends that you continue on your current medications as directed. Please refer to the Current Medication list given to you today.  *If you need a refill on your cardiac medications before your next appointment, please call your pharmacy*  Follow-Up: At [facility], you and your health needs are our priority.  As part of our continuing mission to provide you with exceptional heart care, we have created designated Provider Care Teams.  These Care Teams include your primary Cardiologist (physician) and Advanced Practice Providers (APPs -  Physician Assistants and Nurse Practitioners) who all work together to provide you with the care you need, when you need it.  Your next appointment:   2 year(s)  Provider:   Larae Grooms, MD

## 2022-04-06 NOTE — Addendum Note (Signed)
Addended byDanielle Dess on: 04/06/2022 07:56 AM   Modules accepted: Orders

## 2022-04-19 DIAGNOSIS — K317 Polyp of stomach and duodenum: Secondary | ICD-10-CM | POA: Diagnosis not present

## 2022-04-19 DIAGNOSIS — R933 Abnormal findings on diagnostic imaging of other parts of digestive tract: Secondary | ICD-10-CM | POA: Diagnosis not present

## 2022-04-19 DIAGNOSIS — K219 Gastro-esophageal reflux disease without esophagitis: Secondary | ICD-10-CM | POA: Diagnosis not present

## 2022-04-19 DIAGNOSIS — K2930 Chronic superficial gastritis without bleeding: Secondary | ICD-10-CM | POA: Diagnosis not present

## 2022-04-23 DIAGNOSIS — K2930 Chronic superficial gastritis without bleeding: Secondary | ICD-10-CM | POA: Diagnosis not present

## 2022-04-30 DIAGNOSIS — M545 Low back pain, unspecified: Secondary | ICD-10-CM | POA: Diagnosis not present

## 2022-05-03 DIAGNOSIS — M25511 Pain in right shoulder: Secondary | ICD-10-CM | POA: Diagnosis not present

## 2022-05-03 DIAGNOSIS — M25512 Pain in left shoulder: Secondary | ICD-10-CM | POA: Diagnosis not present

## 2022-05-08 DIAGNOSIS — M5416 Radiculopathy, lumbar region: Secondary | ICD-10-CM | POA: Diagnosis not present

## 2022-05-10 DIAGNOSIS — M179 Osteoarthritis of knee, unspecified: Secondary | ICD-10-CM | POA: Diagnosis not present

## 2022-05-10 DIAGNOSIS — E559 Vitamin D deficiency, unspecified: Secondary | ICD-10-CM | POA: Diagnosis not present

## 2022-05-10 DIAGNOSIS — Z1331 Encounter for screening for depression: Secondary | ICD-10-CM | POA: Diagnosis not present

## 2022-05-10 DIAGNOSIS — I10 Essential (primary) hypertension: Secondary | ICD-10-CM | POA: Diagnosis not present

## 2022-05-10 DIAGNOSIS — R7303 Prediabetes: Secondary | ICD-10-CM | POA: Diagnosis not present

## 2022-05-10 DIAGNOSIS — M109 Gout, unspecified: Secondary | ICD-10-CM | POA: Diagnosis not present

## 2022-05-10 DIAGNOSIS — E7800 Pure hypercholesterolemia, unspecified: Secondary | ICD-10-CM | POA: Diagnosis not present

## 2022-05-10 DIAGNOSIS — J309 Allergic rhinitis, unspecified: Secondary | ICD-10-CM | POA: Diagnosis not present

## 2022-05-10 DIAGNOSIS — D6869 Other thrombophilia: Secondary | ICD-10-CM | POA: Diagnosis not present

## 2022-05-10 DIAGNOSIS — Z8546 Personal history of malignant neoplasm of prostate: Secondary | ICD-10-CM | POA: Diagnosis not present

## 2022-05-10 DIAGNOSIS — Z0000 Encounter for general adult medical examination without abnormal findings: Secondary | ICD-10-CM | POA: Diagnosis not present

## 2022-05-21 DIAGNOSIS — M545 Low back pain, unspecified: Secondary | ICD-10-CM | POA: Diagnosis not present

## 2022-05-21 DIAGNOSIS — M47896 Other spondylosis, lumbar region: Secondary | ICD-10-CM | POA: Diagnosis not present

## 2022-06-20 DIAGNOSIS — M47896 Other spondylosis, lumbar region: Secondary | ICD-10-CM | POA: Diagnosis not present

## 2022-06-29 DIAGNOSIS — M47896 Other spondylosis, lumbar region: Secondary | ICD-10-CM | POA: Diagnosis not present

## 2022-07-06 DIAGNOSIS — M47896 Other spondylosis, lumbar region: Secondary | ICD-10-CM | POA: Diagnosis not present

## 2022-07-13 DIAGNOSIS — M47896 Other spondylosis, lumbar region: Secondary | ICD-10-CM | POA: Diagnosis not present

## 2022-07-20 DIAGNOSIS — M47896 Other spondylosis, lumbar region: Secondary | ICD-10-CM | POA: Diagnosis not present

## 2022-07-25 ENCOUNTER — Emergency Department (HOSPITAL_COMMUNITY): Payer: PPO

## 2022-07-25 ENCOUNTER — Other Ambulatory Visit: Payer: Self-pay

## 2022-07-25 ENCOUNTER — Encounter (HOSPITAL_COMMUNITY): Payer: Self-pay

## 2022-07-25 ENCOUNTER — Emergency Department (HOSPITAL_COMMUNITY)
Admission: EM | Admit: 2022-07-25 | Discharge: 2022-07-25 | Disposition: A | Discharge status: Home / Self Care | Payer: PPO | Attending: Emergency Medicine | Admitting: Emergency Medicine

## 2022-07-25 DIAGNOSIS — R319 Hematuria, unspecified: Secondary | ICD-10-CM | POA: Diagnosis not present

## 2022-07-25 DIAGNOSIS — R310 Gross hematuria: Secondary | ICD-10-CM | POA: Diagnosis not present

## 2022-07-25 LAB — URINALYSIS, ROUTINE W REFLEX MICROSCOPIC
Bacteria, UA: NONE SEEN
Bilirubin Urine: NEGATIVE
Glucose, UA: NEGATIVE mg/dL
Ketones, ur: NEGATIVE mg/dL
Leukocytes,Ua: NEGATIVE
Nitrite: NEGATIVE
Protein, ur: 100 mg/dL — AB
RBC / HPF: >50 RBC/hpf (ref 0–5)
Specific Gravity, Urine: 1.026 (ref 1.005–1.030)
pH: 5 (ref 5.0–8.0)

## 2022-07-25 LAB — CBC WITH DIFFERENTIAL/PLATELET
Abs Immature Granulocytes: 0.03 10*3/uL (ref 0.00–0.07)
Basophils Absolute: 0.1 10*3/uL (ref 0.0–0.1)
Basophils Relative: 1 %
Eosinophils Absolute: 0.3 10*3/uL (ref 0.0–0.5)
Eosinophils Relative: 4 %
HCT: 40.3 % (ref 39.0–52.0)
Hemoglobin: 13.4 g/dL (ref 13.0–17.0)
Immature Granulocytes: 0 %
Lymphocytes Relative: 16 %
Lymphs Abs: 1.3 10*3/uL (ref 0.7–4.0)
MCH: 29.8 pg (ref 26.0–34.0)
MCHC: 33.3 g/dL (ref 30.0–36.0)
MCV: 89.6 fL (ref 80.0–100.0)
Monocytes Absolute: 0.6 10*3/uL (ref 0.1–1.0)
Monocytes Relative: 7 %
Neutro Abs: 5.9 10*3/uL (ref 1.7–7.7)
Neutrophils Relative %: 72 %
Platelets: 203 10*3/uL (ref 150–400)
RBC: 4.5 MIL/uL (ref 4.22–5.81)
RDW: 12.7 % (ref 11.5–15.5)
WBC: 8.2 10*3/uL (ref 4.0–10.5)
nRBC: 0 % (ref 0.0–0.2)

## 2022-07-25 LAB — BASIC METABOLIC PANEL
Anion gap: 10 (ref 5–15)
BUN: 15 mg/dL (ref 8–23)
CO2: 23 mmol/L (ref 22–32)
Calcium: 8.6 mg/dL — ABNORMAL LOW (ref 8.9–10.3)
Chloride: 106 mmol/L (ref 98–111)
Creatinine, Ser: 0.96 mg/dL (ref 0.61–1.24)
GFR, Estimated: >60 mL/min (ref >60)
Glucose, Bld: 107 mg/dL — ABNORMAL HIGH (ref 70–99)
Potassium: 3.3 mmol/L — ABNORMAL LOW (ref 3.5–5.1)
Sodium: 139 mmol/L (ref 135–145)

## 2022-07-25 NOTE — ED Triage Notes (Signed)
Pt had two episodes of urinating and noticed blood in his urine. Pt does take Xarelto, denies any traumatic events, or pain. Pt does not appear to be retaining at this time.

## 2022-07-25 NOTE — ED Provider Notes (Signed)
EMERGENCY DEPARTMENT AT [facility] Provider Note   CSN: [id number] Arrival date & time: 07/25/22  1748     History Chief Complaint  Patient presents with   Hematuria    HPI Joshua Young is a 72 y.o. male presenting for chief complaint of hematuria.  He states that he started having bright red blood in his urine this morning.  2 episodes.  He does endorse some irritation when he urinates.  History of prostatectomy. Denies fevers chills, nausea vomiting eczema shortness of breath.  Does have a history of nephrolithiasis but states it has been approximately 30 years..   Patient's recorded medical, surgical, social, medication list and allergies were reviewed in the Snapshot window as part of the initial history.   Review of Systems   Review of Systems  Constitutional:  Negative for chills and fever.  HENT:  Negative for ear pain and sore throat.   Eyes:  Negative for pain and visual disturbance.  Respiratory:  Negative for cough and shortness of breath.   Cardiovascular:  Negative for chest pain and palpitations.  Gastrointestinal:  Negative for abdominal pain and vomiting.  Genitourinary:  Positive for hematuria. Negative for dysuria.  Musculoskeletal:  Negative for arthralgias and back pain.  Skin:  Negative for color change and rash.  Neurological:  Negative for seizures and syncope.  All other systems reviewed and are negative.   Physical Exam Updated Vital Signs BP (!) 142/94 (BP Location: Left Arm)   Pulse 80   Temp 98.3 F (36.8 C) (Oral)   Resp 18   Ht 5\' 8"  (1.727 m)   Wt 107 kg   SpO2 97%   BMI 35.88 kg/m  Physical Exam Vitals and nursing note reviewed.  Constitutional:      General: He is not in acute distress.    Appearance: He is well-developed.  HENT:     Head: Normocephalic and atraumatic.  Eyes:     Conjunctiva/sclera: Conjunctivae normal.  Cardiovascular:     Rate and Rhythm: Normal rate and regular rhythm.     Heart  sounds: No murmur heard. Pulmonary:     Effort: Pulmonary effort is normal. No respiratory distress.     Breath sounds: Normal breath sounds.  Abdominal:     Palpations: Abdomen is soft.     Tenderness: There is no abdominal tenderness.  Musculoskeletal:        General: No swelling.     Cervical back: Neck supple.  Skin:    General: Skin is warm and dry.     Capillary Refill: Capillary refill takes less than 2 seconds.  Neurological:     Mental Status: He is alert.  Psychiatric:        Mood and Affect: Mood normal.      ED Course/ Medical Decision Making/ A&P Clinical Course as of 07/25/22 2104  Wed Jul 25, 2022  2053 GFR, Estimated: >60 [CC]    Clinical Course User Index [CC] Glyn Ade, MD    Procedures Procedures   Medications Ordered in ED Medications - No data to display  Medical Decision Making:    Joshua Young is a 72 y.o. male who presented to the ED today with hematuria detailed above.     Additional history discussed with patient's family/caregivers.  Patient placed on continuous vitals and telemetry monitoring while in ED which was reviewed periodically.   Complete initial physical exam performed, notably the patient  was hemodynamically stable no acute stress.  No acute  abdominal tenderness.      Reviewed and confirmed nursing documentation for past medical history, family history, social history.    Initial Assessment:   With the patient's presentation of hematuria, most likely diagnosis is UTI. Other diagnoses were considered including (but not limited to) bladder cancer, bladder irritation in the setting of Xarelto, bladder trauma, nephrolithiasis. These are considered less likely due to history of present illness and physical exam findings.   This is most consistent with an acute life/limb threatening illness complicated by underlying chronic conditions.  Initial Plan:  Screening labs including CBC and Metabolic panel to evaluate for  infectious or metabolic etiology of disease.  Urinalysis with reflex culture ordered to evaluate for UTI or relevant urologic/nephrologic pathology.  Renal ultrasound to evaluate for etiology of hematuria Objective evaluation as below reviewed with plan for close reassessment  Initial Study Results:   Laboratory  All laboratory results reviewed without evidence of clinically relevant pathology.   Exceptions include: Large hematuria, proteinuria  Radiology  All images reviewed independently. Agree with radiology report at this time.   US RENAL  Result Date: 07/25/2022 CLINICAL DATA:  Hematuria. EXAM: RENAL / URINARY TRACT ULTRASOUND COMPLETE COMPARISON:  MRI abdomen 01/30/2022 FINDINGS: Right Kidney: Renal measurements: 14.7 x 5.6 x 6.2 cm = volume: 266 mL. Echogenicity within normal limits. No solid mass or hydronephrosis visualized. 1.9 cm lower pole simple cyst. Left Kidney: Renal measurements: 13.1 x 5.7 x 4.3 cm = volume: 166 mL. Echogenicity within normal limits. No hydronephrosis. 6.7 cm simple cyst in the upper pole. 14.7 x 8.5 x 12.3 cm exophytic cyst along the lateral aspect of the kidney which is complex with multiple internal septations, some of which appear thickened and mildly nodular although the overall size of the cyst has not substantially changed from the prior MRI. Bladder: Appears normal for degree of bladder distention. Other: None. IMPRESSION: 1. Large complex left renal cyst, not substantially changed in size from the 01/30/2022 MRI although the septations appear thicker and more nodular by ultrasound than on MRI. 2. No hydronephrosis. Electronically Signed   By: Sebastian Ache M.D.   On: 07/25/2022 20:50    Final Assessment and Plan:   Reassessed at bedside.  No acute distress. Ultrasound grossly nondiagnostic lab work grossly reassuring.  Will plan for patient to follow-up in the outpatient setting with urology for further diagnostic care management and possible need for  cystoscopy should symptoms be persistent.  Will continue on Xarelto given normal hemoglobin otherwise and recommend strict return precautions should he have any acute urinary retention or interval worsening.   Disposition:  I have considered need for hospitalization, however, considering all of the above, I believe this patient is stable for discharge at this time.  Patient/family educated about specific return precautions for given chief complaint and symptoms.  Patient/family educated about follow-up with PCP and urology.     Patient/family expressed understanding of return precautions and need for follow-up. Patient spoken to regarding all imaging and laboratory results and appropriate follow up for these results. All education provided in verbal form with additional information in written form. Time was allowed for answering of patient questions. Patient discharged.    Emergency Department Medication Summary:   Medications - No data to display       Clinical Impression:  1. Hematuria, unspecified type      Discharge   Final Clinical Impression(s) / ED Diagnoses Final diagnoses:  Hematuria, unspecified type    Rx / DC Orders ED  Discharge Orders     None         Glyn Ade, MD 07/25/22 2104

## 2022-07-26 DIAGNOSIS — N281 Cyst of kidney, acquired: Secondary | ICD-10-CM | POA: Diagnosis not present

## 2022-07-26 DIAGNOSIS — R310 Gross hematuria: Secondary | ICD-10-CM | POA: Diagnosis not present

## 2022-07-27 DIAGNOSIS — M47896 Other spondylosis, lumbar region: Secondary | ICD-10-CM | POA: Diagnosis not present

## 2022-08-09 DIAGNOSIS — K429 Umbilical hernia without obstruction or gangrene: Secondary | ICD-10-CM | POA: Diagnosis not present

## 2022-08-09 DIAGNOSIS — K439 Ventral hernia without obstruction or gangrene: Secondary | ICD-10-CM | POA: Diagnosis not present

## 2022-08-09 DIAGNOSIS — N281 Cyst of kidney, acquired: Secondary | ICD-10-CM | POA: Diagnosis not present

## 2022-08-09 DIAGNOSIS — R310 Gross hematuria: Secondary | ICD-10-CM | POA: Diagnosis not present

## 2022-10-16 DIAGNOSIS — H2513 Age-related nuclear cataract, bilateral: Secondary | ICD-10-CM | POA: Diagnosis not present

## 2022-10-16 DIAGNOSIS — H524 Presbyopia: Secondary | ICD-10-CM | POA: Diagnosis not present

## 2022-12-21 DIAGNOSIS — K219 Gastro-esophageal reflux disease without esophagitis: Secondary | ICD-10-CM | POA: Diagnosis not present

## 2022-12-21 DIAGNOSIS — Z860101 Personal history of adenomatous and serrated colon polyps: Secondary | ICD-10-CM | POA: Diagnosis not present

## 2023-01-04 DIAGNOSIS — N210 Calculus in bladder: Secondary | ICD-10-CM | POA: Diagnosis not present

## 2023-01-04 DIAGNOSIS — Z8546 Personal history of malignant neoplasm of prostate: Secondary | ICD-10-CM | POA: Diagnosis not present

## 2023-01-04 DIAGNOSIS — R310 Gross hematuria: Secondary | ICD-10-CM | POA: Diagnosis not present

## 2023-02-04 ENCOUNTER — Other Ambulatory Visit: Payer: Self-pay | Admitting: Urology

## 2023-02-08 ENCOUNTER — Encounter (HOSPITAL_COMMUNITY): Payer: Self-pay

## 2023-02-08 NOTE — Progress Notes (Signed)
COVID Vaccine Completed:  Yes  Date of COVID positive in last 90 days:  PCP - Shelly Flatten, MD Cardiologist - Lance Muss, MD  Chest x-ray -  EKG - 04-05-22 Epic Stress Test - 06-29-20 Epic ECHO - 01-06-13 Epic Cardiac Cath -  Pacemaker/ICD device last checked: Spinal Cord Stimulator:  Bowel Prep -   Sleep Study -  CPAP -   Prediabetes Fasting Blood Sugar -  Checks Blood Sugar _____ times a day  Last dose of GLP1 agonist-  N/A GLP1 instructions:  Hold 7 days before surgery    Last dose of SGLT-2 inhibitors-  N/A SGLT-2 instructions:  Hold 3 days before surgery    Blood Thinner Instructions:  Xarelto Aspirin Instructions: Last Dose:  Activity level:  Can go up a flight of stairs and perform activities of daily living without stopping and without symptoms of chest pain or shortness of breath.  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 Patient was also instructed that they will need to review over the PAT instructions again at home before surgery.

## 2023-02-08 NOTE — Patient Instructions (Addendum)
SURGICAL WAITING ROOM VISITATION Patients having surgery or a procedure may have no more than 2 support people in the waiting area - these visitors may rotate.    Children under the age of 32 must have an adult with them who is not the patient.  If the patient needs to stay at the hospital during part of their recovery, the visitor guidelines for inpatient rooms apply. Pre-op nurse will coordinate an appropriate time for 1 support person to accompany patient in pre-op.  This support person may not rotate.    Please refer to the [facility] website for the visitor guidelines for Inpatients (after your surgery is over and you are in a regular room).       Your procedure is scheduled on: 02-25-23   Report to [facility] Main Entrance    Report to admitting at 1:00 PM   Call this number if you have problems the morning of surgery [phone]   Do not eat food or drink liquids :After Midnight.          If you have questions, please contact your surgeon's office.   FOLLOW  ANY ADDITIONAL PRE OP INSTRUCTIONS YOU RECEIVED FROM YOUR SURGEON'S OFFICE!!!     Oral Hygiene is also important to reduce your risk of infection.                                    Remember - BRUSH YOUR TEETH THE MORNING OF SURGERY WITH YOUR REGULAR TOOTHPASTE   Do NOT smoke after Midnight   Take these medicines the morning of surgery with A SIP OF WATER:   Amlodipine  Claritin  Pantoprazole  Stop all vitamins and herbal supplements 7 days before surgery                              You may not have any metal on your body including  jewelry, and body piercing             Do not wear lotions, powders,  cologne, or deodorant              Men may shave face and neck.   Do not bring valuables to the hospital. [facility] IS NOT RESPONSIBLE   FOR VALUABLES.   Contacts, dentures or bridgework may not be worn into surgery.  DO NOT BRING YOUR HOME MEDICATIONS TO THE HOSPITAL. PHARMACY WILL DISPENSE  MEDICATIONS LISTED ON YOUR MEDICATION LIST TO YOU DURING YOUR ADMISSION IN THE HOSPITAL!    Patients discharged on the day of surgery will not be allowed to drive home.  Someone NEEDS to stay with you for the first 24 hours after anesthesia.               Please read over the following fact sheets you were given: IF YOU HAVE QUESTIONS ABOUT YOUR PRE-OP INSTRUCTIONS PLEASE CALL [phone] [place]wen  If you received a COVID test during your pre-op visit  it is requested that you wear a mask when out in public, stay away from anyone that may not be feeling well and notify your surgeon if you develop symptoms. If you test positive for Covid or have been in contact with anyone that has tested positive in the last 10 days please notify you surgeon.  [place] - Preparing for Surgery Before surgery, you can play an important  role.  Because skin is not sterile, your skin needs to be as free of germs as possible.  You can reduce the number of germs on your skin by washing with CHG (chlorahexidine gluconate) soap before surgery.  CHG is an antiseptic cleaner which kills germs and bonds with the skin to continue killing germs even after washing. Please DO NOT use if you have an allergy to CHG or antibacterial soaps.  If your skin becomes reddened/irritated stop using the CHG and inform your nurse when you arrive at Short Stay. Do not shave (including legs and underarms) for at least 48 hours prior to the first CHG shower.  You may shave your face/neck.  Please follow these instructions carefully:  1.  Shower with CHG Soap the night before surgery and the  morning of surgery.  2.  If you choose to wash your hair, wash your hair first as usual with your normal  shampoo.  3.  After you shampoo, rinse your hair and body thoroughly to remove the shampoo.                             4.  Use CHG as you would any other liquid soap.  You can apply chg directly to the skin and wash.  Gently with a scrungie or clean  washcloth.  5.  Apply the CHG Soap to your body ONLY FROM THE NECK DOWN.   Do   not use on face/ open                           Wound or open sores. Avoid contact with eyes, ears mouth and   genitals (private parts).                       Wash face,  Genitals (private parts) with your normal soap.             6.  Wash thoroughly, paying special attention to the area where your    surgery  will be performed.  7.  Thoroughly rinse your body with warm water from the neck down.  8.  DO NOT shower/wash with your normal soap after using and rinsing off the CHG Soap.                9.  Pat yourself dry with a clean towel.            10.  Wear clean pajamas.            11.  Place clean sheets on your bed the night of your first shower and do not  sleep with pets. Day of Surgery : Do not apply any lotions/deodorants the morning of surgery.  Please wear clean clothes to the hospital/surgery center.  FAILURE TO FOLLOW THESE INSTRUCTIONS MAY RESULT IN THE CANCELLATION OF YOUR SURGERY  PATIENT SIGNATURE_________________________________  NURSE SIGNATURE__________________________________  ________________________________________________________________________

## 2023-02-11 ENCOUNTER — Encounter (HOSPITAL_COMMUNITY)
Admission: RE | Admit: 2023-02-11 | Discharge: 2023-02-11 | Disposition: A | Discharge status: Home / Self Care | Payer: PPO | Source: Ambulatory Visit | Attending: Urology | Admitting: Urology

## 2023-02-11 ENCOUNTER — Encounter (HOSPITAL_COMMUNITY): Payer: Self-pay

## 2023-02-11 ENCOUNTER — Other Ambulatory Visit: Payer: Self-pay

## 2023-02-11 VITALS — BP 143/85 | HR 65 | Temp 98.0°F | Resp 16 | Ht 68.0 in | Wt 233.4 lb

## 2023-02-11 DIAGNOSIS — K219 Gastro-esophageal reflux disease without esophagitis: Secondary | ICD-10-CM | POA: Diagnosis not present

## 2023-02-11 DIAGNOSIS — R7303 Prediabetes: Secondary | ICD-10-CM | POA: Diagnosis not present

## 2023-02-11 DIAGNOSIS — Z923 Personal history of irradiation: Secondary | ICD-10-CM | POA: Insufficient documentation

## 2023-02-11 DIAGNOSIS — Z7901 Long term (current) use of anticoagulants: Secondary | ICD-10-CM | POA: Insufficient documentation

## 2023-02-11 DIAGNOSIS — K760 Fatty (change of) liver, not elsewhere classified: Secondary | ICD-10-CM | POA: Insufficient documentation

## 2023-02-11 DIAGNOSIS — I452 Bifascicular block: Secondary | ICD-10-CM | POA: Diagnosis not present

## 2023-02-11 DIAGNOSIS — W449XXA Unspecified foreign body entering into or through a natural orifice, initial encounter: Secondary | ICD-10-CM | POA: Insufficient documentation

## 2023-02-11 DIAGNOSIS — Z8546 Personal history of malignant neoplasm of prostate: Secondary | ICD-10-CM | POA: Diagnosis not present

## 2023-02-11 DIAGNOSIS — T191XXA Foreign body in bladder, initial encounter: Secondary | ICD-10-CM | POA: Insufficient documentation

## 2023-02-11 DIAGNOSIS — Z01812 Encounter for preprocedural laboratory examination: Secondary | ICD-10-CM | POA: Diagnosis not present

## 2023-02-11 DIAGNOSIS — Z86711 Personal history of pulmonary embolism: Secondary | ICD-10-CM | POA: Diagnosis not present

## 2023-02-11 DIAGNOSIS — Z86718 Personal history of other venous thrombosis and embolism: Secondary | ICD-10-CM | POA: Diagnosis not present

## 2023-02-11 DIAGNOSIS — Z01818 Encounter for other preprocedural examination: Secondary | ICD-10-CM | POA: Diagnosis present

## 2023-02-11 DIAGNOSIS — K769 Liver disease, unspecified: Secondary | ICD-10-CM

## 2023-02-11 HISTORY — DX: Fatty (change of) liver, not elsewhere classified: K76.0

## 2023-02-11 LAB — COMPREHENSIVE METABOLIC PANEL
ALT: 26 U/L (ref 0–44)
AST: 19 U/L (ref 15–41)
Albumin: 3.8 g/dL (ref 3.5–5.0)
Alkaline Phosphatase: 50 U/L (ref 38–126)
Anion gap: 9 (ref 5–15)
BUN: 11 mg/dL (ref 8–23)
CO2: 27 mmol/L (ref 22–32)
Calcium: 8.9 mg/dL (ref 8.9–10.3)
Chloride: 105 mmol/L (ref 98–111)
Creatinine, Ser: 0.83 mg/dL (ref 0.61–1.24)
GFR, Estimated: >60 mL/min (ref >60)
Glucose, Bld: 107 mg/dL — ABNORMAL HIGH (ref 70–99)
Potassium: 3.7 mmol/L (ref 3.5–5.1)
Sodium: 141 mmol/L (ref 135–145)
Total Bilirubin: 1 mg/dL (ref 0.0–1.2)
Total Protein: 7.1 g/dL (ref 6.5–8.1)

## 2023-02-12 NOTE — Anesthesia Preprocedure Evaluation (Addendum)
 Anesthesia Evaluation  Patient identified by MRN, date of birth, ID band Patient awake    Reviewed: Allergy & Precautions, NPO status , Patient's Chart, lab work & pertinent test results  History of Anesthesia Complications (+) PONV, Family history of anesthesia reaction and history of anesthetic complications (uncle had dementia after anesthesia)  Airway Mallampati: III  TM Distance: >3 FB Neck ROM: Full    Dental  (+) Dental Advisory Given   Pulmonary neg shortness of breath, neg sleep apnea, neg COPD, neg recent URI, PE   Pulmonary exam normal breath sounds clear to auscultation       Cardiovascular hypertension (amlodipine, losartan), Pt. on medications (-) angina + DVT  (-) Past MI, (-) Cardiac Stents and (-) CABG + dysrhythmias (RBBB)  Rhythm:Regular Rate:Normal  HLD  Normal stress test 06/29/2020   Neuro/Psych  Headaches, neg Seizures    GI/Hepatic ,GERD  Medicated,,Hepatic steatosis   Endo/Other  negative endocrine ROS    Renal/GU negative Renal ROS   H/o prostate cancer s/p prostatectomy and radiation     Musculoskeletal  (+) Arthritis ,    Abdominal  (+) + obese  Peds  Hematology negative hematology ROS (+)   Anesthesia Other Findings Last Xarelto: yesterday at lunch  Reproductive/Obstetrics                             Anesthesia Physical Anesthesia Plan  ASA: 3  Anesthesia Plan: General   Post-op Pain Management: Tylenol  PO (pre-op)*   Induction: Intravenous  PONV Risk Score and Plan: 3 and Ondansetron , Dexamethasone  and Treatment may vary due to age or medical condition  Airway Management Planned: LMA  Additional Equipment:   Intra-op Plan:   Post-operative Plan: Extubation in OR  Informed Consent: I have reviewed the patients History and Physical, chart, labs and discussed the procedure including the risks, benefits and alternatives for the proposed  anesthesia with the patient or authorized representative who has indicated his/her understanding and acceptance.     Dental advisory given  Plan Discussed with: CRNA and Anesthesiologist  Anesthesia Plan Comments: (See PAT note from 12/30 by K Gekas PA-C  Risks of general anesthesia discussed including, but not limited to, sore throat, hoarse voice, chipped/damaged teeth, injury to vocal cords, nausea and vomiting, allergic reactions, lung infection, heart attack, stroke, and death. All questions answered. )        Anesthesia Quick Evaluation

## 2023-02-19 DIAGNOSIS — Z01812 Encounter for preprocedural laboratory examination: Secondary | ICD-10-CM | POA: Diagnosis not present

## 2023-02-19 DIAGNOSIS — R310 Gross hematuria: Secondary | ICD-10-CM | POA: Diagnosis not present

## 2023-02-22 NOTE — H&P (Signed)
 Office Visit Report     02/19/2023   --------------------------------------------------------------------------------   Joshua Young  MRN: [id number]  DOB: 1950/10/22, 73 year old Male  SSN: [id number]   PRIMARY CARE:  Joshua IVAR Sharps, MD  PRIMARY CARE FAX:  [phone]  REFERRING:  Joshua Young  PROVIDER:  Gretel Young, M.D.  TREATING:  Joshua Young, P.A.  LOCATION:  [facility] [phone]     --------------------------------------------------------------------------------   CC/HPI: Hematuria   Joshua Young returns today for further evaluation of hematuria. He previously underwent an evaluation in 2018 for microscopic hematuria and was found to have a bladder neck calculus with a staple that had eroded into his urethral anastomosis. This was removed. 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 He had another episode of painless gross hematuria that was fairly significant back in June. This subsequently resolved. He did undergo a hematuria protocol CT scan that did not demonstrate significant abnormalities except for large Bosniak 2 renal cysts. He follows up today for cystoscopy to complete his evaluation. He has not noted any recent gross hematuria or change in voiding habits.   02/19/2023: With history of bladder foreign body presents today for preoperative history and physical exam in anticipation of undergoing cystoscopy with foreign body removal on 02/25/2023 with Dr. Renda. Today he is doing well and without complaints. He denies gross hematuria, dysuria, fever/chills, flank pain, nausea/vomiting, chest pain, shortness of breath, headaches.     ALLERGIES: Seafood - gout flare ups    MEDICATIONS: Amlodipine Besylate 2.5 mg tablet 1 tablet PO Daily  Centrum Silver TABS Oral  Fexofenadine HCl - 180 MG Oral Tablet 0 Oral  Fluticasone Propionate 50 MCG/ACT Nasal Suspension 0 Nasal  Losartan Potassium 50 MG Oral Tablet Oral   PriLOSEC OTC 20 MG Oral Tablet Delayed Release Oral  Vitamin C 500 MG Oral Tablet Oral  Xarelto 20 mg tablet Oral     GU PSH: Cysto Remove Stent FB Com - 2018 Cystoscopy - 01/04/2023, 2018 Locm 300-399Mg /Ml Iodine,1Ml - 08/09/2022 Vasectomy - 2012       PSH Notes: Prostatectomy Robotic-Assisted, Surgery Of Male Genitalia Vasectomy   NON-GU PSH: No Non-GU PSH    GU PMH: Bladder Stone - 01/04/2023 Gross hematuria - 01/04/2023, - 08/09/2022 (Stable), - 07/26/2022, - 2018 History of prostate cancer - 01/04/2023, - 10/25/2020, - 2021, - 2017 Renal cyst - 07/26/2022 Left renal neoplasm - 01/03/2022 ED due to arterial insufficiency - 10/25/2020, - 2021, Erectile dysfunction due to arterial insufficiency, - 2017 Prostate Cancer, Prostate cancer - 2017 History of urolithiasis, Nephrolithiasis - 2014      PMH Notes:   1) Prostate cancer: He is s/p a UNS RAL radical prostatectomy on 06/22/10. He completed adjuvant radiation therapy in October 2012 under the care of Dr. Patrcia. His PSA has been undetectable since surgery.   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   Sep 2018: Endoscopic removal of bladder neck calculus and staple   ** He does take Xarelto for a history of recurrent DVT.     NON-GU PMH: Disorder of thyroid , unspecified, Thyroid  mass - 2014 Pulmonary Embolism, History, History of pulmonary embolism - 2014 DVT, History  GERD Hypercholesterolemia Hypertension    FAMILY HISTORY: 1 Daughter - Daughter 1 son - Son Acute Myocardial Infarction - Grandfather Chronic Lymphocytic Leukemia - Father Diabetes - Mother Hypertension - Mother nephrolithiasis - Runs In Family Renal Cell Carcinoma -  Father retinitis pigmentosa - Runs In Family Transient Ischemic Attack - Grandfather   SOCIAL HISTORY: Marital Status: Married Preferred Language: English; Ethnicity: Not Hispanic Or Latino; Race: White Current Smoking Status: Patient has never smoked.  Has never drank.  Does not use drugs. Drinks 1 caffeinated drink per day.     Notes: Never A Smoker, Marital History - Currently Married, Tobacco Use, Alcohol Use, Occupation:   REVIEW OF SYSTEMS:    GU Review Male:   Patient denies frequent urination, hard to postpone urination, burning/ pain with urination, get up at night to urinate, leakage of urine, stream starts and stops, trouble starting your stream, have to strain to urinate , erection problems, and penile pain.  Gastrointestinal (Upper):   Patient denies nausea, vomiting, and indigestion/ heartburn.  Gastrointestinal (Lower):   Patient denies diarrhea and constipation.  Constitutional:   Patient denies fever, night sweats, weight loss, and fatigue.  Skin:   Patient denies skin rash/ lesion and itching.  Eyes:   Patient denies blurred vision and double vision.  Ears/ Nose/ Throat:   Patient denies sore throat and sinus problems.  Hematologic/Lymphatic:   Patient denies swollen glands and easy bruising.  Cardiovascular:   Patient denies leg swelling and chest pains.  Respiratory:   Patient denies cough and shortness of breath.  Endocrine:   Patient denies excessive thirst.  Musculoskeletal:   Patient denies back pain and joint pain.  Neurological:   Patient denies headaches and dizziness.  Psychologic:   Patient denies depression and anxiety.   Notes: 12/4 last occurrence of hematuria    VITAL SIGNS:      02/19/2023 03:39 PM  BP 152/83 mmHg  Pulse 73 /min  Temperature 97.5 F / 36.3 C   MULTI-SYSTEM PHYSICAL EXAMINATION:    Constitutional: Well-nourished. No physical deformities. Normally developed. Good grooming.  Neck: Neck symmetrical, not swollen. Normal tracheal  position.  Respiratory: No labored breathing, no use of accessory muscles.   Cardiovascular: Normal temperature, normal extremity pulses, no swelling, no varicosities.  Lymphatic: No enlargement of neck, axillae, groin.  Skin: No paleness, no jaundice, no cyanosis. No lesion, no ulcer, no rash.  Neurologic / Psychiatric: Oriented to time, oriented to place, oriented to person. No depression, no anxiety, no agitation.  Gastrointestinal: No mass, no tenderness, no rigidity, non obese abdomen.  Musculoskeletal: Normal gait and station of head and neck.     Complexity of Data:  Source Of History:  Patient, Medical Record Summary  Records Review:   Previous Doctor Records, Previous Hospital Records, Previous Patient Records  Urine Test Review:   Urinalysis  X-Ray Review: C.T. Abdomen/Pelvis: Reviewed Report.     10/18/20 10/07/19 10/06/18 09/27/17 09/26/16 09/28/15 09/21/15 03/03/15  PSA  Total PSA <0.015 ng/mL <0.015 ng/mL <0.015 ng/mL <0.015 ng/mL <0.015 ng/dL < 9.97 ng/dl 0.1  <9.98     98/92/74  Urinalysis  Urine Appearance Clear   Urine Color Yellow   Urine Glucose Neg mg/dL  Urine Bilirubin Neg mg/dL  Urine Ketones Neg mg/dL  Urine Specific Gravity 1.025   Urine Blood Neg ery/uL  Urine pH <=5.0   Urine Protein Neg mg/dL  Urine Urobilinogen 0.2 mg/dL  Urine Nitrites Neg   Urine Leukocyte Esterase Neg leu/uL   PROCEDURES:  Visit Complexity - G2211          Urinalysis Dipstick Dipstick Cont'd  Color: Yellow Bilirubin: Neg mg/dL  Appearance: Clear Ketones: Neg mg/dL  Specific Gravity: 8.974 Blood: Neg ery/uL  pH: <=5.0 Protein: Neg mg/dL  Glucose: Neg mg/dL Urobilinogen: 0.2 mg/dL    Nitrites: Neg    Leukocyte Esterase: Neg leu/uL    ASSESSMENT:      ICD-10 Details  1 GU:   Gross hematuria - R31.0 Acute, Complicated Injury  2   Bladder Stone - N21.0 Acute, Stable   PLAN:           Orders Labs Urine Culture          Schedule Return Visit/Planned  Activity: Keep Scheduled Appointment          Document Letter(s):  Created for Patient: Clinical Summary         Notes:   UA today clear. Preop culture sent. there are no changes in the patients history or physical exam since last evaluation. Pt is scheduled to undergo cystoscopy with foreign body removal on 02/25/2023.   All questions were answered to the best of my ability.         Next Appointment:      Next Appointment: 02/25/2023 03:15 PM    Appointment Type: Surgery     Location: [facility] [phone]    Provider: Gretel Ferrara, M.D.    Reason for Visit: WL/OP CYSTO AND REMOVAL OF FOREIGN BODY      * Signed by Joshua Young, P.A. on 02/19/23 at 3:46 PM (EST)*

## 2023-02-25 ENCOUNTER — Ambulatory Visit (HOSPITAL_BASED_OUTPATIENT_CLINIC_OR_DEPARTMENT_OTHER): Payer: Self-pay | Admitting: Anesthesiology

## 2023-02-25 ENCOUNTER — Encounter (HOSPITAL_COMMUNITY): Payer: Self-pay | Admitting: Urology

## 2023-02-25 ENCOUNTER — Encounter (HOSPITAL_COMMUNITY)
Admission: RE | Disposition: A | Discharge status: Home / Self Care | Payer: Self-pay | Source: Ambulatory Visit | Attending: Urology

## 2023-02-25 ENCOUNTER — Ambulatory Visit (HOSPITAL_COMMUNITY)
Admission: RE | Admit: 2023-02-25 | Discharge: 2023-02-25 | Disposition: A | Discharge status: Home / Self Care | Payer: PPO | Source: Ambulatory Visit | Attending: Urology | Admitting: Urology

## 2023-02-25 ENCOUNTER — Ambulatory Visit (HOSPITAL_COMMUNITY): Payer: PPO | Admitting: Medical

## 2023-02-25 ENCOUNTER — Other Ambulatory Visit: Payer: Self-pay

## 2023-02-25 DIAGNOSIS — Z86718 Personal history of other venous thrombosis and embolism: Secondary | ICD-10-CM | POA: Insufficient documentation

## 2023-02-25 DIAGNOSIS — I10 Essential (primary) hypertension: Secondary | ICD-10-CM

## 2023-02-25 DIAGNOSIS — K219 Gastro-esophageal reflux disease without esophagitis: Secondary | ICD-10-CM | POA: Diagnosis not present

## 2023-02-25 DIAGNOSIS — Z923 Personal history of irradiation: Secondary | ICD-10-CM | POA: Diagnosis not present

## 2023-02-25 DIAGNOSIS — Z8546 Personal history of malignant neoplasm of prostate: Secondary | ICD-10-CM | POA: Diagnosis not present

## 2023-02-25 DIAGNOSIS — R310 Gross hematuria: Secondary | ICD-10-CM | POA: Insufficient documentation

## 2023-02-25 DIAGNOSIS — T191XXA Foreign body in bladder, initial encounter: Secondary | ICD-10-CM | POA: Diagnosis not present

## 2023-02-25 DIAGNOSIS — N210 Calculus in bladder: Secondary | ICD-10-CM | POA: Insufficient documentation

## 2023-02-25 DIAGNOSIS — T190XXA Foreign body in urethra, initial encounter: Secondary | ICD-10-CM | POA: Diagnosis not present

## 2023-02-25 DIAGNOSIS — E785 Hyperlipidemia, unspecified: Secondary | ICD-10-CM | POA: Diagnosis not present

## 2023-02-25 DIAGNOSIS — Y838 Other surgical procedures as the cause of abnormal reaction of the patient, or of later complication, without mention of misadventure at the time of the procedure: Secondary | ICD-10-CM | POA: Insufficient documentation

## 2023-02-25 DIAGNOSIS — Z86711 Personal history of pulmonary embolism: Secondary | ICD-10-CM | POA: Diagnosis not present

## 2023-02-25 DIAGNOSIS — Z7901 Long term (current) use of anticoagulants: Secondary | ICD-10-CM | POA: Insufficient documentation

## 2023-02-25 DIAGNOSIS — Y733 Surgical instruments, materials and gastroenterology and urology devices (including sutures) associated with adverse incidents: Secondary | ICD-10-CM | POA: Diagnosis not present

## 2023-02-25 HISTORY — PX: FOREIGN BODY REMOVAL: SHX962

## 2023-02-25 SURGERY — FOREIGN BODY REMOVAL ADULT
Anesthesia: General

## 2023-02-25 MED ORDER — LIDOCAINE 2% (20 MG/ML) 5 ML SYRINGE
INTRAMUSCULAR | Status: DC | PRN
  Administered 2023-02-25: 40 mg via INTRAVENOUS
  Administered 2023-02-25: 60 mg via INTRAVENOUS

## 2023-02-25 MED ORDER — CEFAZOLIN SODIUM-DEXTROSE 2-4 GM/100ML-% IV SOLN
2.0000 g | INTRAVENOUS | Status: AC
Start: 2023-02-25 — End: 2023-02-25
  Administered 2023-02-25: 2 g via INTRAVENOUS
  Filled 2023-02-25: qty 100

## 2023-02-25 MED ORDER — OXYCODONE HCL 5 MG PO TABS: 5.0000 mg | ORAL_TABLET | Freq: Once | ORAL | Status: DC | PRN

## 2023-02-25 MED ORDER — PHENYLEPHRINE 80 MCG/ML (10ML) SYRINGE FOR IV PUSH (FOR BLOOD PRESSURE SUPPORT)
PREFILLED_SYRINGE | INTRAVENOUS | Status: AC
  Filled 2023-02-25: qty 10

## 2023-02-25 MED ORDER — CHLORHEXIDINE GLUCONATE 0.12 % MT SOLN
15.0000 mL | Freq: Once | OROMUCOSAL | Status: AC
  Administered 2023-02-25: 15 mL via OROMUCOSAL

## 2023-02-25 MED ORDER — EPHEDRINE 5 MG/ML INJ
INTRAVENOUS | Status: AC
  Filled 2023-02-25: qty 5

## 2023-02-25 MED ORDER — EPHEDRINE SULFATE-NACL 50-0.9 MG/10ML-% IV SOSY
PREFILLED_SYRINGE | INTRAVENOUS | Status: DC | PRN
  Administered 2023-02-25: 10 mg via INTRAVENOUS

## 2023-02-25 MED ORDER — ACETAMINOPHEN 500 MG PO TABS
1000.0000 mg | ORAL_TABLET | Freq: Once | ORAL | Status: AC
  Administered 2023-02-25: 1000 mg via ORAL
  Filled 2023-02-25: qty 2

## 2023-02-25 MED ORDER — DEXAMETHASONE SODIUM PHOSPHATE 10 MG/ML IJ SOLN
INTRAMUSCULAR | Status: DC | PRN
  Administered 2023-02-25: 10 mg via INTRAVENOUS

## 2023-02-25 MED ORDER — PHENYLEPHRINE 80 MCG/ML (10ML) SYRINGE FOR IV PUSH (FOR BLOOD PRESSURE SUPPORT)
PREFILLED_SYRINGE | INTRAVENOUS | Status: DC | PRN
  Administered 2023-02-25 (×2): 80 ug via INTRAVENOUS
  Administered 2023-02-25: 160 ug via INTRAVENOUS

## 2023-02-25 MED ORDER — DEXAMETHASONE SODIUM PHOSPHATE 10 MG/ML IJ SOLN
INTRAMUSCULAR | Status: AC
Start: 2023-02-25 — End: ?
  Filled 2023-02-25: qty 1

## 2023-02-25 MED ORDER — STERILE WATER FOR IRRIGATION IR SOLN
Status: DC | PRN
  Administered 2023-02-25: 3000 mL

## 2023-02-25 MED ORDER — ONDANSETRON HCL 4 MG/2ML IJ SOLN
INTRAMUSCULAR | Status: DC | PRN
  Administered 2023-02-25: 4 mg via INTRAVENOUS

## 2023-02-25 MED ORDER — SODIUM CHLORIDE 0.9 % IV SOLN: INTRAVENOUS | Status: DC

## 2023-02-25 MED ORDER — AMISULPRIDE (ANTIEMETIC) 5 MG/2ML IV SOLN: 10.0000 mg | Freq: Once | INTRAVENOUS | Status: DC | PRN

## 2023-02-25 MED ORDER — ORAL CARE MOUTH RINSE: 15.0000 mL | Freq: Once | OROMUCOSAL | Status: AC

## 2023-02-25 MED ORDER — FENTANYL CITRATE PF 50 MCG/ML IJ SOSY: 25.0000 ug | PREFILLED_SYRINGE | INTRAMUSCULAR | Status: DC | PRN

## 2023-02-25 MED ORDER — FENTANYL CITRATE (PF) 100 MCG/2ML IJ SOLN
INTRAMUSCULAR | Status: AC
  Filled 2023-02-25: qty 2

## 2023-02-25 MED ORDER — OXYCODONE HCL 5 MG/5ML PO SOLN: 5.0000 mg | Freq: Once | ORAL | Status: DC | PRN

## 2023-02-25 MED ORDER — PROPOFOL 10 MG/ML IV BOLUS
INTRAVENOUS | Status: AC
  Filled 2023-02-25: qty 20

## 2023-02-25 MED ORDER — PROPOFOL 10 MG/ML IV BOLUS
INTRAVENOUS | Status: DC | PRN
  Administered 2023-02-25: 50 mg via INTRAVENOUS
  Administered 2023-02-25: 150 mg via INTRAVENOUS
  Administered 2023-02-25: 100 mg via INTRAVENOUS

## 2023-02-25 MED ORDER — FENTANYL CITRATE (PF) 100 MCG/2ML IJ SOLN
INTRAMUSCULAR | Status: DC | PRN
  Administered 2023-02-25 (×2): 25 ug via INTRAVENOUS

## 2023-02-25 MED ORDER — LIDOCAINE HCL (PF) 2 % IJ SOLN
INTRAMUSCULAR | Status: AC
  Filled 2023-02-25: qty 5

## 2023-02-25 MED ORDER — ONDANSETRON HCL 4 MG/2ML IJ SOLN
INTRAMUSCULAR | Status: AC
  Filled 2023-02-25: qty 2

## 2023-02-25 SURGICAL SUPPLY — 13 items
BAG URO CATCHER STRL LF (MISCELLANEOUS) ×1 IMPLANT
BASKET ZERO TIP NITINOL 2.4FR (BASKET) IMPLANT
CATH URETL OPEN END 6FR 70 (CATHETERS) IMPLANT
CLOTH BEACON ORANGE TIMEOUT ST (SAFETY) ×1 IMPLANT
GLOVE SURG LX STRL 7.5 STRW (GLOVE) ×1 IMPLANT
GOWN STRL REUS W/ TWL XL LVL3 (GOWN DISPOSABLE) ×1 IMPLANT
GUIDEWIRE STR DUAL SENSOR (WIRE) ×1 IMPLANT
GUIDEWIRE ZIPWRE .038 STRAIGHT (WIRE) IMPLANT
KIT TURNOVER KIT A (KITS) IMPLANT
MANIFOLD NEPTUNE II (INSTRUMENTS) ×1 IMPLANT
PACK CYSTO (CUSTOM PROCEDURE TRAY) ×1 IMPLANT
TUBING CONNECTING 10 (TUBING) ×1 IMPLANT
TUBING UROLOGY SET (TUBING) IMPLANT

## 2023-02-25 NOTE — Anesthesia Procedure Notes (Signed)
 Procedure Name: LMA Insertion Date/Time: 02/25/2023 2:55 PM  Performed by: Therisa Doyal CROME, CRNAPatient Re-evaluated:Patient Re-evaluated prior to induction Oxygen Delivery Method: Circle system utilized Preoxygenation: Pre-oxygenation with 100% oxygen Induction Type: IV induction LMA: LMA inserted LMA Size: 4.0 Number of attempts: 2 Placement Confirmation: positive ETCO2 and breath sounds checked- equal and bilateral Tube secured with: Tape Dental Injury: Teeth and Oropharynx as per pre-operative assessment

## 2023-02-25 NOTE — Op Note (Signed)
 Preoperative diagnosis: Bladder neck calculus with urethral foreign body  Postoperative diagnosis: Bladder neck calculus with urethral foreign body  Procedures: 1.  Cystoscopy 2.  Removal bladder neck calculus/urethral foreign body  Surgeon: Gretel CANDIE Renda Mickey MD  Anesthesia: General  Complications: None  EBL: Minimal  Specimens: None  Indication: Joshua Young is a 73 year old gentleman with a distant history of prostate cancer status posttreatment with radical prostatectomy and subsequent radiation therapy.  He recently was noted to have gross hematuria on anticoagulation.  His evaluation included office cystoscopy which revealed a bladder neck calculus and an underlying urethral foreign body that appeared to likely be a staple from his distant prostatectomy that had migrated into his urethra.  After reviewing options, he was agreeable to proceed with treatment and the above procedures.  The potential risks, complications, and the alternative options were discussed in detail.  Informed consent was obtained.  Description of procedure: The patient was taken to the operating room and a general anesthetic was administered.  He was given preoperative antibiotics, placed in the dorsolithotomy position, and prepped and draped in the usual sterile fashion.  Next, a preoperative timeout was performed.  Cystourethroscopy was performed with both a 30 degree and 70 degree lens.  There was a remnant of the bladder neck calculus that was identified anteriorly at the 12 o'clock position just inside the bladder neck.  Initial attempts to remove this with a flexible grasper were unsuccessful due to the difficult angle.  A 0 tip nitinol basket was then used and threaded through the staple.  The basket was then opened and the staple was able to be pushed off the mucosa.  This was then removed with a flexible grasper.  On further inspection, there was an additional small staple that was also migrating through  the mucosa.  This was able to be grasped with a flexible grasper and removed.  Reinspection of the bladder neck with the 70 degree lens revealed no remaining foreign bodies.  The bladder was carefully inspected.  There were no bladder tumors or stones.  There were radiation changes on the bladder mucosa circumferentially.  No active bleeding was noted.  The patient's bladder was emptied and the procedure was ended.  He tolerated the procedure well without complications.  He was able to be awakened and transferred to the recovery unit in satisfactory condition.

## 2023-02-25 NOTE — Anesthesia Postprocedure Evaluation (Signed)
 Anesthesia Post Note  Patient: Joshua Young  Procedure(s) Performed: CYSTOSCOPY AND FOREIGN BODY REMOVAL ADULT     Patient location during evaluation: PACU Anesthesia Type: General Level of consciousness: awake Pain management: pain level controlled Vital Signs Assessment: post-procedure vital signs reviewed and stable Respiratory status: spontaneous breathing, nonlabored ventilation and respiratory function stable Cardiovascular status: blood pressure returned to baseline and stable Postop Assessment: no apparent nausea or vomiting Anesthetic complications: no   No notable events documented.  Last Vitals:  Vitals:   02/25/23 1615 02/25/23 1637  BP: (!) 142/81 (!) 146/72  Pulse: 63 60  Resp: 14 16  Temp:  (!) 36.4 C  SpO2: 97% 98%    Last Pain:  Vitals:   02/25/23 1637  TempSrc:   PainSc: 0-No pain                 Delon Aisha Arch

## 2023-02-25 NOTE — Transfer of Care (Signed)
 Immediate Anesthesia Transfer of Care Note  Patient: Joshua Young  Procedure(s) Performed: CYSTOSCOPY AND FOREIGN BODY REMOVAL ADULT  Patient Location: PACU  Anesthesia Type:General  Level of Consciousness: awake, alert , and oriented  Airway & Oxygen Therapy: Patient Spontanous Breathing and Patient connected to face mask oxygen  Post-op Assessment: Report given to RN and Post -op Vital signs reviewed and stable  Post vital signs: Reviewed and stable  Last Vitals:  Vitals Value Taken Time  BP    Temp    Pulse 67 02/25/23 1542  Resp 22 02/25/23 1542  SpO2 100 % 02/25/23 1542  Vitals shown include unfiled device data.  Last Pain:  Vitals:   02/25/23 1313  TempSrc: Oral  PainSc:          Complications: No notable events documented.

## 2023-02-25 NOTE — Discharge Instructions (Signed)
 You may see some blood in the urine and may have some burning with urination for 48-72 hours. You also may notice that you have to urinate more frequently or urgently after your procedure which is normal.  You should call should you develop an inability urinate, fever > 101, persistent nausea and vomiting that prevents you from eating or drinking to stay hydrated.

## 2023-02-25 NOTE — Interval H&P Note (Signed)
 History and Physical Interval Note:  02/25/2023 2:15 PM  Joshua Young  has presented today for surgery, with the diagnosis of BLADDER FOREIGN BODY.  The various methods of treatment have been discussed with the patient and family. After consideration of risks, benefits and other options for treatment, the patient has consented to  Procedure(s) with comments: CYSTOSCOPY (N/A) CYSTOSCOPY AND FOREIGN BODY REMOVAL ADULT (N/A) - 30 MINUTES NEEDED FOR CASE as a surgical intervention.  The patient's history has been reviewed, patient examined, no change in status, stable for surgery.  I have reviewed the patient's chart and labs.  Questions were answered to the patient's satisfaction.     [employer]

## 2023-02-26 ENCOUNTER — Encounter (HOSPITAL_COMMUNITY): Payer: Self-pay | Admitting: Urology

## 2023-05-17 DIAGNOSIS — Z0000 Encounter for general adult medical examination without abnormal findings: Secondary | ICD-10-CM | POA: Diagnosis not present

## 2023-05-17 DIAGNOSIS — M109 Gout, unspecified: Secondary | ICD-10-CM | POA: Diagnosis not present

## 2023-05-17 DIAGNOSIS — Z1331 Encounter for screening for depression: Secondary | ICD-10-CM | POA: Diagnosis not present

## 2023-05-17 DIAGNOSIS — L219 Seborrheic dermatitis, unspecified: Secondary | ICD-10-CM | POA: Diagnosis not present

## 2023-05-17 DIAGNOSIS — R7303 Prediabetes: Secondary | ICD-10-CM | POA: Diagnosis not present

## 2023-05-17 DIAGNOSIS — Z86711 Personal history of pulmonary embolism: Secondary | ICD-10-CM | POA: Diagnosis not present

## 2023-05-17 DIAGNOSIS — E041 Nontoxic single thyroid nodule: Secondary | ICD-10-CM | POA: Diagnosis not present

## 2023-05-17 DIAGNOSIS — M25559 Pain in unspecified hip: Secondary | ICD-10-CM | POA: Diagnosis not present

## 2023-05-17 DIAGNOSIS — E7800 Pure hypercholesterolemia, unspecified: Secondary | ICD-10-CM | POA: Diagnosis not present

## 2023-05-17 DIAGNOSIS — Z8546 Personal history of malignant neoplasm of prostate: Secondary | ICD-10-CM | POA: Diagnosis not present

## 2023-05-17 DIAGNOSIS — E559 Vitamin D deficiency, unspecified: Secondary | ICD-10-CM | POA: Diagnosis not present

## 2023-05-17 DIAGNOSIS — I10 Essential (primary) hypertension: Secondary | ICD-10-CM | POA: Diagnosis not present

## 2023-05-17 DIAGNOSIS — M179 Osteoarthritis of knee, unspecified: Secondary | ICD-10-CM | POA: Diagnosis not present

## 2023-06-20 DIAGNOSIS — D34 Benign neoplasm of thyroid gland: Secondary | ICD-10-CM | POA: Diagnosis not present

## 2023-06-28 DIAGNOSIS — E042 Nontoxic multinodular goiter: Secondary | ICD-10-CM | POA: Diagnosis not present

## 2023-10-17 DIAGNOSIS — D3132 Benign neoplasm of left choroid: Secondary | ICD-10-CM | POA: Diagnosis not present

## 2023-10-17 DIAGNOSIS — H2513 Age-related nuclear cataract, bilateral: Secondary | ICD-10-CM | POA: Diagnosis not present

## 2023-10-17 DIAGNOSIS — H524 Presbyopia: Secondary | ICD-10-CM | POA: Diagnosis not present

## 2023-12-02 DIAGNOSIS — M25562 Pain in left knee: Secondary | ICD-10-CM | POA: Diagnosis not present

## 2023-12-02 DIAGNOSIS — M25561 Pain in right knee: Secondary | ICD-10-CM | POA: Diagnosis not present

## 2023-12-16 DIAGNOSIS — M25512 Pain in left shoulder: Secondary | ICD-10-CM | POA: Diagnosis not present

## 2023-12-16 DIAGNOSIS — M25511 Pain in right shoulder: Secondary | ICD-10-CM | POA: Diagnosis not present

## 2023-12-27 DIAGNOSIS — Z860101 Personal history of adenomatous and serrated colon polyps: Secondary | ICD-10-CM | POA: Diagnosis not present

## 2023-12-27 DIAGNOSIS — K219 Gastro-esophageal reflux disease without esophagitis: Secondary | ICD-10-CM | POA: Diagnosis not present

## 2024-02-20 NOTE — Progress Notes (Signed)
 Joshua Young                                          MRN: [id number]   02/20/2024   The [facility] Quality Team Specialist reviewed this patient medical record for the purposes of chart review for care gap closure. The following were reviewed: chart review for care gap closure-controlling blood pressure.    [facility] Quality Team
